# Patient Record
Sex: Male | Born: 1995 | Race: White | Hispanic: No | Marital: Single | Smoking: Never smoker
Health system: Southern US, Community
[De-identification: ages and names within clinical notes are randomized; demographics above are authoritative.]

## PROBLEM LIST (undated history)

## (undated) DIAGNOSIS — L409 Psoriasis, unspecified: Secondary | ICD-10-CM

## (undated) DIAGNOSIS — R569 Unspecified convulsions: Secondary | ICD-10-CM

## (undated) DIAGNOSIS — G40909 Epilepsy, unspecified, not intractable, without status epilepticus: Secondary | ICD-10-CM

## (undated) HISTORY — PX: CIRCUMCISION: SHX1350

## (undated) HISTORY — DX: Psoriasis, unspecified: L40.9

---
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
## 2015-05-27 ENCOUNTER — Encounter: Payer: Self-pay | Admitting: Neurology

## 2015-05-27 ENCOUNTER — Ambulatory Visit (INDEPENDENT_AMBULATORY_CARE_PROVIDER_SITE_OTHER): Payer: Medicaid Other | Admitting: Neurology

## 2015-05-27 VITALS — BP 102/70 | Ht 68.25 in | Wt 204.8 lb

## 2015-05-27 DIAGNOSIS — G40909 Epilepsy, unspecified, not intractable, without status epilepticus: Secondary | ICD-10-CM | POA: Diagnosis not present

## 2015-05-27 MED ORDER — LEVETIRACETAM 750 MG PO TABS: 750.0000 mg | ORAL_TABLET | Freq: Two times a day (BID) | ORAL | Status: DC

## 2015-05-27 NOTE — Progress Notes (Signed)
Patient: WHITMAN MEINHARDT MRN: [id number] Sex: male DOB: 05/03/1995  Provider: Keturah Shavers, MD Location of Care: [facility]  Note type: Routine return visit  Referral Source: Dr, Barbie Banner History from: patient, referring office, [facility] chart and mother Chief Complaint: Seizure disorder  History of Present Illness: DEPAUL ARIZPE is a 20 y.o. male is here for follow-up management of seizure disorder. He has history of generalized seizure disorder and has been on Keppra 750 mg twice a day since May 2015 with good seizure control and no seizure activity since then. He has been tolerating medication well with no side effects. There is no family history of epilepsy. His last EEG was in 12/25/2014 with normal results. His last visit was October 2016. Since then he has had no medical issues and has had no clinical seizure activity has been doing well with no other complaints. On his last visit it was discussed that if he remains seizure-free with normal EEG then we may consider taking him off of medication gradually over the next few months.   Review of Systems: 12 system review as per HPI, otherwise negative.  Past Medical History  Diagnosis Date  . Seizure (HCC)   . Epilepsy (HCC)    Hospitalizations: No., Head Injury: No., Nervous System Infections: No., Immunizations up to date: Yes.    Surgical History Past Surgical History  Procedure Laterality Date  . Circumcision      Family History family history is not on file.  Social History Social History   Social History  . Marital Status: Single    Spouse Name: N/A  . Number of Children: N/A  . Years of Education: N/A   Social History Main Topics  . Smoking status: Passive Smoke Exposure - Never Smoker  . Smokeless tobacco: Never Used  . Alcohol Use: No  . Drug Use: No  . Sexual Activity: Yes    Birth Control/ Protection: Condom   Other Topics Concern  . None   Social History  Narrative   Nels graduated from high school in 2014. He is not employed.   Linn enjoys playing video games and spending time with friends.   He is living with his parents and brother.    The medication list was reviewed and reconciled. All changes or newly prescribed medications were explained.  A complete medication list was provided to the patient/caregiver.  No Known Allergies  Physical Exam BP 102/70 mmHg  Ht 5' 8.25" (1.734 m)  Wt 204 lb 12.9 oz (92.9 kg)  BMI 30.90 kg/m2 Gen: Awake, alert, sitting on exam table in NAD.  Skin: Scaly plaque over nose and bilateral cheeks and on extensor surface of the arms.  HEENT: Normocephalic, atraumatic. MMM, pharynx non-erythematous.  Neck: Supple, no meningismus. No focal tenderness. No cervical or submandibular lymphadenopathy.  Resp: Clear to auscultation bilaterally CV: Regular rate, normal S1/S2, no murmurs Abd: abdomen soft, non-tender, non-distended. No hepatosplenomegaly or mass Ext: Warm and well-perfused. Pulses 2+ in bilateral radial and dorsalis pedis pulses.   Neurological Examination: MS: Awake, alert, interactive. Good eye contact. Fluent speech. Normal concentration and attention.  Cranial Nerves: PERRLA bilaterally (4-18mm); visual fields full; EOM intact with no nystagmus; no ptosis; facial expression symmetric at rest and with activation. Hearing intact to finger rub bilaterally. Palate elevates symmetrically. Tongue protrudes midline with normal movement; SCM and trapezius muscles 5/5 strength.  Tone-Normal Strength- 5/5 in upper and lower extremities bilaterally. No pronator drift. DTRs- 2+ bilaterally in biceps, triceps,  brachioradialis, patellar, and ankle reflexes Sensation: Intact to light touch, Romberg negative. Coordination: FTN and HTS intact with no dysmetria bilaterally.  Gait: gait is narrow based and normal. Tandem gait was normal.    Assessment and Plan 1. Seizure disorder [facility](HCC)     This is a 20 year old young male with history of generalized seizure disorder with no clinical seizure activity for about 2 years and normal EEG in November of last year. He has been on moderate dose of Keppra with good seizure control. He has no focal findings on his neurological examination. Since it has been 2 years from his last seizure and he would like to be off of the medication, I would recommend to gradually decrease the dose of medication or the next few months and if he remains seizure-free, he would be completely off of medication during the summer time. He will decrease the dose of Keppra half a tablet each month and then I will see him in 3 months for follow-up visit. I may repeat his EEG after his next visit when he is completely off of medication and if his EEG does not show any electrographic seizure activity and he remains seizure-free then he does not need to be on antiseizure medication and will be considered to be safe without being on any medication for seizure. I also discussed regarding the seizure triggers particularly lack of sleep and bright light. I also discussed the seizure precautions with patient and his mother while he is tapering his seizure medication. I would like to see him in 3 months for follow-up visit.  Meds ordered this encounter  Medications  . alclomethasone (ACLOVATE) 0.05 % cream    Sig: APPLY TO AFFECTED AREAS OF FACE 2 TIMES DAILY AS NEEDED    Refill:  1  . triamcinolone cream (KENALOG) 0.1 %    Sig: APPLY TO AFFECTED AREAS OF TRUNK AND EXTREMITIES 2 TIMES DAILY AS NEEDED    Refill:  1  . levETIRAcetam (KEPPRA) 750 MG tablet    Sig: Take 1 tablet (750 mg total) by mouth 2 (two) times daily.    Dispense:  62 tablet    Refill:  2

## 2015-05-28 ENCOUNTER — Other Ambulatory Visit: Payer: Self-pay | Admitting: Neurology

## 2015-09-24 NOTE — Progress Notes (Signed)
Patient: Cameron Jimenez MRN: [id number] Sex: male DOB: 11-Jul-1995  Provider: Keturah ShaversNABIZADEH, Nevelyn Mellott, MD [facility]  Note type: Routine return visit  Referral Source: Dr, Barbie BannerShayne E. Bates History from: patient, referring office and [facility] Chief Complaint: Seizure disorder  History of Present Illness:  Cameron Jimenez is a 20 y.o. male  is here for follow-up management of seizure disorder. He was last seen in clinic on 05/27/2015. He has history of generalized seizure disorder that is being managed on Keppra. He had been on Keppra 750 mg twice a day since May 2015 with good seizure control and no seizure activity since then so decision was made at his last clinic visit to gradually decrease and discontinue the dose. He is now down to Keppra 375 mg daily since 08/10/2015. He has not had any seizure activity with decrease in dose. He continues to tolerate the medication well, though has had some weight gain on it.   His last EEG was in 12/25/2014 with normal results. He has been medically very well with no clinical seizure activity since he was last seen. On his last visit it was discussed that if he remains seizure-free with normal EEG then we may leave him off of medication.    Review of Systems: 12 system review as per HPI, otherwise negative.  Past Medical History:  Diagnosis Date  . Epilepsy (HCC)   . Seizure (HCC)    Hospitalizations: No., Head Injury: No., Nervous System Infections: No., Immunizations up to date: Yes.    Birth History Normal  Surgical History Past Surgical History:  Procedure Laterality Date  . CIRCUMCISION      Family History family history is not on file. Family History is negative for seizure disorder.  Social History Social History   Social History  . Marital status: Single    Spouse name: N/A  . Number of children: N/A  . Years of education: N/A   Social History Main Topics  . Smoking status: Passive  Smoke Exposure - Never Smoker  . Smokeless tobacco: Never Used  . Alcohol use No  . Drug use: No  . Sexual activity: Yes    Birth control/ protection: Condom   Other Topics Concern  . None   Social History Narrative   Cameron Jimenez graduated from high school in 2014. He is not employed.   Cameron Jimenez enjoys playing video games and spending time with friends.   He is living with his parents and brother.     The medication list was reviewed and reconciled. All changes or newly prescribed medications were explained.  A complete medication list was provided to the patient/caregiver.  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 (interval improvement)  Neurologic Exam  Mental Status: alert; oriented to person, place and year; knowledge is normal for age; language is normal Cranial Nerves: visual fields are full to double simultaneous stimuli; extraocular movements are full and conjugate; pupils  are round reactive to light; funduscopic examination appears normal; symmetric facial strength; midline tongue and uvula Motor: Normal strength, tone and mass; good fine motor movements; no pronator drift Sensory: intact responses to proprioception and light touch Coordination: good finger-to-nose, rapid repetitive alternating movements Gait and Station: normal gait and station: patient is able to  walk on heels, toes and tandem without difficulty; balance is adequate; Romberg exam is negative Reflexes: symmetric and diminished bilaterally; no clonus; bilateral flexor plantar responses   Assessment and Plan 1. Seizure disorder (HCC) - EEG Child; Future  Cameron Jimenez is a 20 yo M with history of generalized seizure disorder with no clinical seizure activity for over 2 years now. He has remained seizure free even with gradual decrease in Keppra dose from 750 mg BID to 375 mg QDay over the last 4 months (decreased by 1/2 tablet or 375 mg every month). He had a normal EEG in November 2016. His physical exam remains benign today with no focal neurological findings. Given the fact that he has tolerated the gradual decrease in Keppra dose and remained seizure-free, and that his last decrease in the dose occurred > 1 month ago on 08/10/2015, I recommend that Cameron Jimenez stop the Keppra following today's visit. I gave him the option of repeating an EEG following discontinuation of Keppra and patient and mother would like to proceed with this in a few weeks.  If his EEG does not demonstrate any electrographic seizure activity and he remains seizure-free, he does not need to be seen for follow up in our clinic. If his EEG does demonstrate abnormalities or if he has clinical seizure acitivity, I will plan to see him back in the clinic at which time I will consider restarting Cameron Jimenez on antiseizure medication.     Orders Placed This Encounter  Procedures  . EEG Child    Standing Status:   Future    Standing Expiration Date:   09/24/2016

## 2015-09-25 ENCOUNTER — Ambulatory Visit (INDEPENDENT_AMBULATORY_CARE_PROVIDER_SITE_OTHER): Payer: Medicaid Other | Admitting: Neurology

## 2015-09-25 ENCOUNTER — Encounter: Payer: Self-pay | Admitting: Neurology

## 2015-09-25 VITALS — BP 120/70 | Ht 67.75 in | Wt 217.2 lb

## 2015-09-25 DIAGNOSIS — G40909 Epilepsy, unspecified, not intractable, without status epilepticus: Secondary | ICD-10-CM | POA: Diagnosis not present

## 2015-09-27 ENCOUNTER — Telehealth: Payer: Self-pay

## 2015-09-27 NOTE — Telephone Encounter (Signed)
Edwina, mom, lvm stating that child had a sz this morning. He was weaned off levetiracetam, today is day 2 completley off of the medication. Mom can be reached at : [phone].

## 2015-09-27 NOTE — Telephone Encounter (Signed)
I called mother. He had a clinical seizure activity during sleep for around 5 minutes witnessed by his brother. Mother was a shower when she came out, he was sleepy in bed but he was able to answer his mother partially. He did not have any tongue biting and no loss of bladder control but as per mother and as per brother he was jumping all over in his bed during those few minutes. He is already scheduled for an EEG next week but mother with not like to wait and would like to start him on medication again. I will start him back on the same dose of Keppra at 750 mg twice a day and after the EEG will decide ifb he can decrease the dose to 500 mg twice a day if no significant findings on EEG.

## 2015-10-03 ENCOUNTER — Ambulatory Visit (HOSPITAL_COMMUNITY)
Admission: RE | Admit: 2015-10-03 | Discharge: 2015-10-03 | Disposition: A | Discharge status: Home / Self Care | Payer: Medicaid Other | Source: Ambulatory Visit | Attending: Neurology | Admitting: Neurology

## 2015-10-03 DIAGNOSIS — R569 Unspecified convulsions: Secondary | ICD-10-CM | POA: Diagnosis not present

## 2015-10-03 DIAGNOSIS — G40909 Epilepsy, unspecified, not intractable, without status epilepticus: Secondary | ICD-10-CM | POA: Insufficient documentation

## 2015-10-03 DIAGNOSIS — Z79899 Other long term (current) drug therapy: Secondary | ICD-10-CM | POA: Insufficient documentation

## 2015-10-03 NOTE — Progress Notes (Signed)
OP EEG completed, results pending. 

## 2015-10-04 NOTE — Procedures (Signed)
Patient:  Cameron GrinderChristopher S Selsor   Sex: male  DOB:  1996/02/06  Date of study: 10/03/2015  Clinical history: This is a 20 year old male with history of generalized seizure disorder for which he had been on Keppra for several years with more than 2 years of being seizure free so the medication tapered missing a few months and discontinued recently. Patient had an episode of clinical seizure activity witnessed by his brother although it is not definitely clear if it was a true epileptic event. Patient was restarted on Keppra. EEG was done to benefit for possible epileptic event.  Medication: Keppra  Procedure: The tracing was carried out on a 32 channel digital Cadwell recorder reformatted into 16 channel montages with 1 devoted to EKG.  The 10 /20 international system electrode placement was used. Recording was done during awake and drowsiness. Recording time 30.5 Minutes.   Description of findings: Background rhythm consists of amplitude of 30 microvolt and frequency of  10 hertz posterior dominant rhythm. There was normal anterior posterior gradient noted. Background was well organized, continuous and symmetric with no focal slowing. There was muscle artifact noted. During drowsiness there was gradual decrease in background frequency noted. There were occasional vertex sharp waves noted as well.  Hyperventilation resulted in no significant slowing of the background activity. Photic stimulation using stepwise increase in photic frequency resulted in bilateral symmetric driving response. Throughout the recording there were no focal or generalized epileptiform activities in the form of spikes or sharps noted. There were no transient rhythmic activities or electrographic seizures noted. One lead EKG rhythm strip revealed sinus rhythm at a rate of 80 bpm.  Impression: This EEG is normal during awake and drowsy states. Please note that normal EEG does not exclude epilepsy, clinical correlation is  indicated.     Keturah ShaversNABIZADEH, Mauriah Mcmillen, MD

## 2015-10-08 ENCOUNTER — Telehealth: Payer: Self-pay

## 2015-10-08 NOTE — Telephone Encounter (Signed)
Mom lvm inquiring about EEG results and plan for medication. CB# [phone]

## 2015-10-09 MED ORDER — LEVETIRACETAM ER 500 MG PO TB24: 500.0000 mg | ORAL_TABLET | Freq: Every day | ORAL | 5 refills | Status: DC

## 2015-10-09 NOTE — Telephone Encounter (Signed)
I called mother and discussed the EEG result which was normal. I am not sure that the event he had was a real seizure but I would continue low-dose of long-acting Keppra for 4-6 months and then we'll decide if we can discontinue the medication again at that point. Mother understood and agreed with the plan. I would like to see him in 4-6 months for follow-up visit.

## 2015-10-17 ENCOUNTER — Encounter (HOSPITAL_COMMUNITY): Payer: Self-pay | Admitting: Emergency Medicine

## 2015-10-17 DIAGNOSIS — L02411 Cutaneous abscess of right axilla: Secondary | ICD-10-CM | POA: Insufficient documentation

## 2015-10-17 DIAGNOSIS — Z7722 Contact with and (suspected) exposure to environmental tobacco smoke (acute) (chronic): Secondary | ICD-10-CM | POA: Diagnosis not present

## 2015-10-17 NOTE — ED Triage Notes (Signed)
Pt. reports abscess at right axilla onset last week with drainage and swelling . Denies fever or chills.

## 2015-10-18 ENCOUNTER — Emergency Department (HOSPITAL_COMMUNITY)
Admission: EM | Admit: 2015-10-18 | Discharge: 2015-10-18 | Disposition: A | Discharge status: Home / Self Care | Payer: Medicaid Other | Attending: Emergency Medicine | Admitting: Emergency Medicine

## 2015-10-18 DIAGNOSIS — L02411 Cutaneous abscess of right axilla: Secondary | ICD-10-CM

## 2015-10-18 MED ORDER — TRAMADOL HCL 50 MG PO TABS: 50.0000 mg | ORAL_TABLET | Freq: Four times a day (QID) | ORAL | 0 refills | Status: AC | PRN

## 2015-10-18 MED ORDER — HYDROCODONE-ACETAMINOPHEN 5-325 MG PO TABS
1.0000 | ORAL_TABLET | Freq: Once | ORAL | Status: AC
  Administered 2015-10-18: 1 via ORAL
  Filled 2015-10-18: qty 1

## 2015-10-18 MED ORDER — DOXYCYCLINE HYCLATE 100 MG PO TABS
100.0000 mg | ORAL_TABLET | Freq: Once | ORAL | Status: AC
  Administered 2015-10-18: 100 mg via ORAL
  Filled 2015-10-18: qty 1

## 2015-10-18 MED ORDER — DOXYCYCLINE HYCLATE 100 MG PO CAPS: 100.0000 mg | ORAL_CAPSULE | Freq: Two times a day (BID) | ORAL | 0 refills | Status: AC

## 2015-10-18 NOTE — ED Provider Notes (Signed)
[facility]-EMERGENCY DEPT Provider Note   CSN: [id number] Arrival date & time: 10/17/15  2140     History   Chief Complaint Chief Complaint  Patient presents with  . Abscess    HPI Cameron Jimenez is a 20 y.o. male who presents to the ED with an abscess to the right axilla. The area started draining tonight. He has been applying warm wet compresses to the area.   The history is provided by the patient. No language interpreter was used.  Abscess  Location:  Shoulder/arm Shoulder/arm abscess location:  R axilla Abscess quality: draining   Red streaking: no   Duration:  4 days Progression:  Partially resolved Relieved by:  Nothing Worsened by:  Nothing Ineffective treatments:  Warm compresses Risk factors: prior abscess     Past Medical History:  Diagnosis Date  . Epilepsy (HCC)   . Seizure [facility])     Patient Active Problem List   Diagnosis Date Noted  . Syncope, near 06/28/2012  . Seizure disorder (HCC) 06/28/2012  . 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 Other convulsions 06/14/2012  . Syncope and collapse 06/14/2012  . Epilepsy [facility])     Past Surgical History:  Procedure Laterality Date  . CIRCUMCISION         Home Medications    Prior to Admission medications   Medication Sig Start Date End Date Taking? Authorizing Provider  alclomethasone (ACLOVATE) 0.05 % cream APPLY TO AFFECTED AREAS OF FACE 2 TIMES DAILY AS NEEDED 04/26/15   Historical Provider, MD  doxycycline (VIBRAMYCIN) 100 MG capsule Take 1 capsule (100 mg total) by mouth 2 (two) times daily. 10/18/15   Samanta Gal Orlene Och, NP  levETIRAcetam (KEPPRA XR) 500 MG 24 hr tablet Take 1 tablet (500 mg total) by mouth at bedtime. 10/09/15   Keturah Shavers, MD  traMADol (ULTRAM) 50 MG tablet Take 1 tablet (50 mg total) by mouth every 6 (six) hours as needed. 10/18/15   Toniyah Dilmore Orlene Och, NP  triamcinolone cream (KENALOG) 0.1 % APPLY TO AFFECTED AREAS OF TRUNK AND EXTREMITIES  2 TIMES DAILY AS NEEDED 04/26/15   Historical Provider, MD    Family History No family history on file.  Social History Social History  Substance Use Topics  . Smoking status: Passive Smoke Exposure - Never Smoker  . Smokeless tobacco: Never Used  . Alcohol use No     Allergies   Review of patient's allergies indicates no known allergies.   Review of Systems Review of Systems  Skin: Positive for wound.  all other systems negative  Physical Exam Updated Vital Signs BP 134/81 (BP Location: Right Arm)   Pulse 92   Temp 99.2 F (37.3 C) (Oral)   Resp 16   Ht 5\' 7"  (1.702 m)   Wt 98.4 kg   SpO2 98%   BMI 33.99 kg/m   Physical Exam  Constitutional: He is oriented to person, place, and time. He appears well-developed and well-nourished.  HENT:  Head: Normocephalic.  Eyes: EOM are normal.  Neck: Neck supple.  Cardiovascular: Normal rate.   Pulmonary/Chest: Effort normal.  Abdominal: Soft. There is no tenderness.  Musculoskeletal: Normal range of motion.  Neurological: He is alert and oriented to person, place, and time. No cranial nerve deficit.  Skin: Skin is warm and dry.  Right axilla with 1 cm area of erythema and purulent drainage. Tender on palpation.   Nursing note and vitals reviewed.    ED Treatments / Results  Labs (all labs ordered are listed, but only abnormal results are displayed) Labs Reviewed - No data to display   Radiology No results found.  Procedures Procedures (including critical care time)  Medications Ordered in ED Medications  doxycycline (VIBRA-TABS) tablet 100 mg (not administered)  HYDROcodone-acetaminophen (NORCO/VICODIN) 5-325 MG per tablet 1 tablet (not administered)     Initial Impression / Assessment and Plan / ED Course  I have reviewed the triage vital signs and the nursing notes.   Clinical Course    Final Clinical Impressions(s) / ED Diagnoses  20 y.o. male with abscess to the right axilla stable for d/c  without fever and does not appear toxic. He will take antibiotics and follow up with general surgery. He will return here as needed.   Final diagnoses:  Abscess of axilla, right    New Prescriptions New Prescriptions   DOXYCYCLINE (VIBRAMYCIN) 100 MG CAPSULE    Take 1 capsule (100 mg total) by mouth 2 (two) times daily.   TRAMADOL (ULTRAM) 50 MG TABLET    Take 1 tablet (50 mg total) by mouth every 6 (six) hours as needed.     [address] Kady Toothaker, [place] 10/18/15 [id number]    Azalia BilisKevin Campos, MD 10/18/15 [phone]

## 2016-01-07 ENCOUNTER — Ambulatory Visit (INDEPENDENT_AMBULATORY_CARE_PROVIDER_SITE_OTHER): Payer: Medicaid Other | Admitting: Neurology

## 2016-01-07 ENCOUNTER — Encounter (INDEPENDENT_AMBULATORY_CARE_PROVIDER_SITE_OTHER): Payer: Self-pay | Admitting: Neurology

## 2016-01-07 VITALS — Ht 68.0 in | Wt 217.8 lb

## 2016-01-07 DIAGNOSIS — G40909 Epilepsy, unspecified, not intractable, without status epilepticus: Secondary | ICD-10-CM

## 2016-01-07 NOTE — Progress Notes (Signed)
Patient: Cameron Jimenez MRN: [id number] Sex: male DOB: 04/05/1995  Provider: Keturah ShaversNABIZADEH, Dayten Juba, MD [facility]  Note type: Routine return visit  Referral Source: Dr. Barbie BannerShayne E. Bates History from: mother and patient Chief Complaint: Seizure Disorder  History of Present Illness: Cameron GrinderChristopher S Jimenez is a 20 y.o. male is here for follow-up management of seizure disorder. He was last seen in August 2017 and since he had been seizure-free for long time with gradual decrease in the dosage of Keppra and with normal EEG, it was decided to discontinue Keppra and see how he does. Within a couple of weeks after stopping the medication he had an episode of clinical seizure activity witnessed by his brother and mother which look like to be true epileptic event. He was via started on Keppra and underwent an EEG which was normal. Currently he is on 500 mg of long-acting Keppra, tolerating well with no side effects and has had no clinical seizure since then over the past 3 months.  Review of Systems: 12 system review as per HPI, otherwise negative.  Past Medical History:  Diagnosis Date  . Epilepsy (HCC)   . Seizure (HCC)    Hospitalizations: No., Head Injury: No., Nervous System Infections: No., Immunizations up to date: Yes.     Surgical History Past Surgical History:  Procedure Laterality Date  . CIRCUMCISION      Family History family history is not on file.   Social History Social History   Social History  . Marital status: Single    Spouse name: N/A  . Number of children: N/A  . Years of education: N/A   Social History Main Topics  . Smoking status: Passive Smoke Exposure - Never Smoker  . Smokeless tobacco: Never Used  . Alcohol use No  . Drug use: No  . Sexual activity: Yes    Birth control/ protection: Condom   Other Topics Concern  . None   Social History Narrative   Cameron DeerChristopher graduated from high school in 2014. He is not  employed.   Cameron DeerChristopher enjoys playing video games and spending time with friends.   He is living with his parents and brother.    The medication list was reviewed and reconciled. All changes or newly prescribed medications were explained.  A complete medication list was provided to the patient/caregiver.  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 (interval improvement)  Neurologic Exam  Mental Status: alert; oriented to person, place and year; knowledge is normal for age; language is normal Cranial Nerves: visual fields are full to double simultaneous stimuli; extraocular movements are full and conjugate; pupils are round reactive to light; funduscopic examination appears normal; symmetric facial strength; midline tongue and uvula Motor: Normal strength, tone and mass; good fine motor movements; no pronator drift Sensory: intact responses to proprioception and light touch Coordination: good finger-to-nose, rapid repetitive alternating movements Gait and Station: normal gait and station: patient is able to walk on  heels, toes and tandem without difficulty; balance is adequate; Romberg exam is negative Reflexes: symmetric and diminished bilaterally; no clonus; bilateral flexor plantar responses   Assessment and Plan 1. Seizure disorder [facility](HCC)    This is a 20 year old young male with history of  generalized seizure disorder who had been seizure-free for long time and the medication was discontinued a few months ago but he started having clinical seizure activity although his follow-up EEG was normal but he is started on low-dose Keppra and doing well since then. Currently he is on 500 mg Keppra XR which I recommend to continue although if there is any clinical seizure activity then we might need to increase the dose of medication. I discussed with patient regarding seizure precautions particularly seizure triggers including lack of sleep and bright light. I would like to see him in 8-9 months for follow-up visit although if there is any medical seizure activity, he will call to make an earlier appointment. He and his mother understood and agreed with the plan.

## 2016-04-15 ENCOUNTER — Other Ambulatory Visit: Payer: Self-pay | Admitting: Neurology

## 2016-10-13 ENCOUNTER — Ambulatory Visit (INDEPENDENT_AMBULATORY_CARE_PROVIDER_SITE_OTHER): Payer: Self-pay | Admitting: Neurology

## 2016-10-13 ENCOUNTER — Encounter (INDEPENDENT_AMBULATORY_CARE_PROVIDER_SITE_OTHER): Payer: Self-pay | Admitting: Neurology

## 2016-10-13 VITALS — BP 110/62 | HR 62 | Ht 67.72 in | Wt 224.6 lb

## 2016-10-13 DIAGNOSIS — G40909 Epilepsy, unspecified, not intractable, without status epilepticus: Secondary | ICD-10-CM

## 2016-10-13 MED ORDER — LEVETIRACETAM ER 500 MG PO TB24: 500.0000 mg | ORAL_TABLET | Freq: Every day | ORAL | 8 refills | Status: DC

## 2016-10-13 NOTE — Progress Notes (Signed)
Patient: Cameron Jimenez MRN: [id number] Sex: male DOB: Jan 17, 1996  Provider: Keturah Shavers, MD Location of Care: [facility]  Note type: Routine return visit  Referral Source: Dr. Jenne Pane- but not seeing her due to age- no current PCP History from: patient, mother Chief Complaint: follow up seizures  History of Present Illness: Cameron Jimenez is a 21 y.o. male  is here for management of seizure disorder . He has history of generalized seizure disorder for several years and since he was seizure-free for long time, he was taken off of his medication but he had an episode of clinical seizure after being off of medications so patient started his medication with low dose of 500 mg Keppra ER. She has been on this dose of medication over the past year with no more clinical seizure activity and his last EEG was normal. He has been tolerating medication well with no side effects. He usually sleeps well without any difficulty and has no behavioral or mood issues. He was recently started on methotrexate for his psoriasis otherwise no change in his medical condition since his last visit.  Review of Systems: 12 system review as per HPI, otherwise negative.  Past Medical History:  Diagnosis Date  . Epilepsy (HCC)   . Psoriasis    dx by Vaughan Sine  . Seizure (HCC)    Hospitalizations: No., Head Injury: No., Nervous System Infections: No., Immunizations up to date: Yes.    Surgical History Past Surgical History:  Procedure Laterality Date  . CIRCUMCISION      Family History family history includes Cancer in his maternal grandmother.   Social History Social History   Social History  . Marital status: Single    Spouse name: N/A  . Number of children: N/A  . Years of education: N/A   Social History Main Topics  . Smoking status: Passive Smoke Exposure - Never Smoker  . Smokeless tobacco: Never Used  . Alcohol use No  . Drug use: No  . Sexual activity: Yes     Birth control/ protection: Condom   Other Topics Concern  . None   Social History Narrative   Cameron Jimenez graduated from high school in 2014. He is not employed.   Cameron Jimenez enjoys playing video games and spending time with friends.   He is living with his parents and brother.   The medication list was reviewed and reconciled. All changes or newly prescribed medications were explained.  A complete medication list was provided to the patient/caregiver.  No Known Allergies  Physical Exam BP 110/62   Pulse 62   Ht 5' 7.72" (1.72 m)   Wt 224 lb 10.4 oz (101.9 kg)   BMI 34.44 kg/m  Gen: Awake, alert, not in distress Skin:  Psoriatic erythematous plaques on extensors HEENT: Normocephalic, no dysmorphic features, no conjunctival injection, nares patent, mucous membranes moist, oropharynx clear. Neck: Supple, no meningismus. No focal tenderness. Resp: Clear to auscultation bilaterally CV: Regular rate, normal S1/S2, no murmurs, no rubs Abd: BS present, abdomen soft, non-tender, non-distended. No hepatosplenomegaly or mass Ext: Warm and well-perfused. no muscle wasting, ROM full.  Neurological Examination: MS: Awake, alert, interactive. Normal eye contact, answered the questions appropriately, speech was fluent,  Normal comprehension.  Attention and concentration were normal. Cranial Nerves: Pupils were equal and reactive to light ( 5-46mm);  normal fundoscopic exam with sharp discs, visual field full with confrontation test; EOM normal, no nystagmus; no ptsosis, no double vision, intact facial sensation, face symmetric with full  strength of facial muscles, hearing intact to finger rub bilaterally, palate elevation is symmetric, tongue protrusion is symmetric with full movement to both sides.  Sternocleidomastoid and trapezius are with normal strength. Tone-Normal Strength-Normal strength in all muscle groups DTRs-  Biceps Triceps Brachioradialis Patellar Ankle  R 2+ 2+ 2+ 2+ 2+  L 2+  2+ 2+ 2+ 2+   Plantar responses flexor bilaterally, no clonus noted Sensation: Intact to light touch,  Romberg negative. Coordination: No dysmetria on FTN test. No difficulty with balance. Gait: Normal walk and run. Tandem gait was normal. Was able to perform toe walking and heel walking without difficulty.   Assessment and Plan 1. Seizure disorder [facility](HCC)    This is a 21 year old male with history of generalized seizure disorder, currently on very low-dose of Keppra with no clinical seizure activity since last year and normal EEG which was done last year. He has no focal findings and his neurological examination. Since he hasn't had any clinical seizure activity, I will continue the same low dose of Keppra ER at 500 mg every night. If there is any clinical seizure activity, mother will call my office to increased dose of medication. He will continue with appropriate sleep and limited screen time. I do not think he needs a follow-up EEG at this point. I would like to see him in 8-9 months for follow-up visit. He and his mother understood and agreed with the plan.  Meds ordered this encounter  Medications  . methotrexate (RHEUMATREX) 2.5 MG tablet    Sig: TAKE 2 TABLETS BY MOUTH EVERY 12 HOURS x 3 doses per (6 tabs per week)    Refill:  0  . levETIRAcetam (KEPPRA XR) 500 MG 24 hr tablet    Sig: Take 1 tablet (500 mg total) by mouth at bedtime.    Dispense:  30 tablet    Refill:  8

## 2016-11-23 ENCOUNTER — Other Ambulatory Visit: Payer: Self-pay | Admitting: Family

## 2018-01-19 ENCOUNTER — Other Ambulatory Visit (INDEPENDENT_AMBULATORY_CARE_PROVIDER_SITE_OTHER): Payer: Self-pay | Admitting: Neurology

## 2018-01-24 ENCOUNTER — Ambulatory Visit (INDEPENDENT_AMBULATORY_CARE_PROVIDER_SITE_OTHER): Payer: Self-pay | Admitting: Neurology

## 2018-02-10 ENCOUNTER — Ambulatory Visit (INDEPENDENT_AMBULATORY_CARE_PROVIDER_SITE_OTHER): Payer: Self-pay | Admitting: Neurology

## 2018-02-10 ENCOUNTER — Encounter (INDEPENDENT_AMBULATORY_CARE_PROVIDER_SITE_OTHER): Payer: Self-pay | Admitting: Neurology

## 2018-02-10 VITALS — BP 116/70 | HR 74 | Ht 68.11 in | Wt 226.9 lb

## 2018-02-10 DIAGNOSIS — G40909 Epilepsy, unspecified, not intractable, without status epilepticus: Secondary | ICD-10-CM

## 2018-02-10 MED ORDER — LEVETIRACETAM 250 MG PO TABS: 250.0000 mg | ORAL_TABLET | Freq: Every day | ORAL | 1 refills | Status: DC

## 2018-02-10 NOTE — Progress Notes (Signed)
Patient: Cameron Jimenez MRN: [id number] Sex: male DOB: [birth year]  Provider: Keturah Shaverseza Harsha Yusko, MD [facility]  Note type: Routine return visit  Referral Source: Cameron MouldShayne Bates, MD [facility]: [facility] and Mom Chief Complaint: Follow up on seizures  History of Present Illness: Cameron Jimenez is a 23 y.o. male is here for follow-up management of seizure disorder.  He has history of generalized seizure disorder for long time and has been on Keppra with no clinical seizure activity and with normal EEGs for the past few years although he was tried to be off of medication a couple of years ago when he had a clinical seizure activity a few days after being off of medication so he was back on medication on fairly low-dose of 500 mg Keppra ER every night and no clinical seizure activity since then which is around 2 years. His last EEG was in 2017 which was normal.  Over the past couple of years he has not had any clinical seizure activity and has been taking low-dose Keppra regularly every night. He also has psoriasis for which she has been taking methotrexate and also has acne for which he is on doxycycline. He is doing well otherwise and usually sleeps well and currently is not working or going to college and usually stays home.  He does not drive.  Review of Systems: 12 system review as per HPI, otherwise negative.  Past Medical History:  Diagnosis Date  . Epilepsy (HCC)   . Psoriasis    dx by Vaughan Sineerm  . Seizure (HCC)    Hospitalizations: No., Head Injury: No., Nervous System Infections: No., Immunizations up to date: Yes.    Surgical History Past Surgical History:  Procedure Laterality Date  . CIRCUMCISION      Family History family history includes Cancer in his maternal grandmother.   Social History Social History   Socioeconomic History  . Marital status: Single    Spouse name: Not on file  . Number of children: Not on  file  . Years of education: Not on file  . Highest education level: Not on file  Occupational History  . Not on file  Social Needs  . Financial resource strain: Not on file  . Food insecurity:    Worry: Not on file    Inability: Not on file  . Transportation needs:    Medical: Not on file    Non-medical: Not on file  Tobacco Use  . Smoking status: Passive Smoke Exposure - Never Smoker  . Smokeless tobacco: Never Used  Substance and Sexual Activity  . Alcohol use: No  . Drug use: No  . Sexual activity: Yes    Birth control/protection: Condom  Lifestyle  . Physical activity:    Days per week: Not on file    Minutes per session: Not on file  . Stress: Not on file  Relationships  . Social connections:    Talks on phone: Not on file    Gets together: Not on file    Attends religious service: Not on file    Active member of club or organization: Not on file    Attends meetings of clubs or organizations: Not on file    Relationship status: Not on file  Other Topics Concern  . Not on file  Social History Narrative   Cameron Jimenez graduated from high school in 2014. He is not employed.   Cameron Jimenez enjoys playing video games and spending time with friends.  He is living with his parents and brother.     The medication list was reviewed and reconciled. All changes or newly prescribed medications were explained.  A complete medication list was provided to the patient/caregiver.  No Known Allergies  Physical Exam BP 116/70   Pulse 74   Ht 5' 8.11" (1.73 m)   Wt 226 lb 13.7 oz (102.9 kg)   BMI 34.38 kg/m  Gen: Awake, alert, not in distress Skin: No rash, No neurocutaneous stigmata.  Has psoriasis plaques on his extensors HEENT: Normocephalic, no conjunctival injection, nares patent, mucous membranes moist, oropharynx clear. Neck: Supple, no meningismus. No cervical bruit. No focal tenderness. Resp: Clear to auscultation bilaterally CV: Regular rate, normal S1/S2, no  murmurs, no rubs Abd: abdomen soft, non-tender, non-distended. No hepatosplenomegaly or mass Ext: Warm and well-perfused. no muscle wasting, ROM full.  Neurological Examination: MS: Awake, alert, interactive. Normal eye contact, answered the questions appropriately, speech was fluent,  Normal comprehension.  Attention and concentration were normal. Cranial Nerves: Pupils were equal and reactive to light ( 5-483mm); no APD, normal fundoscopic exam with sharp discs, visual field full with confrontation test; EOM normal, no nystagmus; no ptsosis, no double vision, intact facial sensation, face symmetric with full strength of facial muscles, hearing intact to  Finger rub bilaterally,  tongue protrusion is symmetric with full movement to both sides.  Sternocleidomastoid and trapezius are with normal strength. Tone-Normal Strength-Normal strength in all muscle groups DTRs-  Biceps Triceps Brachioradialis Patellar Ankle  R 2+ 2+ 2+ 2+ 2+  L 2+ 2+ 2+ 2+ 2+   Plantar responses flexor bilaterally, no clonus noted Sensation: Intact to light touch,  Romberg negative. Coordination: No dysmetria on FTN test. Normal RAM. No difficulty with balance. Gait: Normal walk and run. Tandem gait was normal. Was able to perform toe walking and heel walking without difficulty.   Assessment and Plan 1. Seizure disorder [facility](HCC)    This is a 23 year old male with history of seizure disorder since 2012 for which he was initially on Lamictal and after tapering in 2014 started having clinical seizure activity and then he was on Keppra with no clinical seizure activity but again after tapering in 2017 he had another clinical seizure activity although his EEG was negative. Currently he is on very low-dose of Keppra with no seizure over the past 3 years.  He has no other issues and has normal neurological exam. Recommend to decrease the dose of Keppra to 250 mg every night for the next couple of months and if he continues to  be seizure-free, he may discontinue the Keppra but if there is any clinical seizure activity then I would go back up on the medication. I would like to perform a sleep deprived EEG when he is on lower dose of medication in a couple weeks. I also discussed with patient the importance of adequate sleep and limited screen time to prevent from seizure activity. I do not think he needs follow-up appointment with neurology but if his EEG is abnormal or if he had more clinical seizure activity then I would schedule him for a follow-up visit otherwise he will continue follow-up with his primary care physician.  He and his mother understood and agreed with the plan.   Meds ordered this encounter  Medications  . levETIRAcetam (KEPPRA) 250 MG tablet    Sig: Take 1 tablet (250 mg total) by mouth at bedtime.    Dispense:  30 tablet    Refill:  1  Orders Placed This Encounter  Procedures  . Child sleep deprived EEG    Standing Status:   Future    Standing Expiration Date:   02/10/2019

## 2018-02-10 NOTE — Patient Instructions (Signed)
Continue with Keppra 500/250 every other day for the next 10 days Then continue with Keppra 250 every night for 2 months If no seizure, discontinue medication at that time We will perform EEG in a couple of weeks If no seizure, no need for follow-up appointment although if there is any question or concerns, call the office at any time

## 2018-03-02 ENCOUNTER — Ambulatory Visit (INDEPENDENT_AMBULATORY_CARE_PROVIDER_SITE_OTHER): Payer: Self-pay | Admitting: Neurology

## 2018-03-02 DIAGNOSIS — G40909 Epilepsy, unspecified, not intractable, without status epilepticus: Secondary | ICD-10-CM

## 2018-03-03 ENCOUNTER — Telehealth (INDEPENDENT_AMBULATORY_CARE_PROVIDER_SITE_OTHER): Payer: Self-pay | Admitting: Neurology

## 2018-03-03 NOTE — Procedures (Signed)
Patient:  Cameron Jimenez   Sex: male  DOB:  1995/05/05   Date of study: 03/02/2018  Clinical history: This is a 23 year old male with history of seizure disorder since 2012 who was seizure-free for a while and was on tapering of Keppra when he had another seizure in 2017 and since then he has been on very low-dose of Keppra and now he is trying to taper and discontinue medication.  This is a follow-up EEG on very low-dose of Keppra.  Medication: Keppra  Procedure: The tracing was carried out on a 32 channel digital Cadwell recorder reformatted into 16 channel montages with 1 devoted to EKG.  The 10 /20 international system electrode placement was used. Recording was done during awake, drowsiness and sleep states. Recording time 50 minutes.   Description of findings: Background rhythm consists of amplitude of 50 microvolt and frequency of 10-11 hertz posterior dominant rhythm. There was normal anterior posterior gradient noted. Background was well organized, continuous and symmetric with no focal slowing. There was muscle artifact noted. During drowsiness there was gradual decrease in background frequency noted.  There was no significant sleep portion noted with occasional brief vertex sharp waves but no sleep spindles noted.    Hyperventilation resulted in slowing of the background activity. Photic stimulation using stepwise increase in photic frequency resulted in bilateral symmetric driving response. Throughout the recording there were no focal or generalized epileptiform activities in the form of spikes or sharps noted. There were no transient rhythmic activities or electrographic seizures noted. One lead EKG rhythm strip revealed sinus rhythm at a rate of 70 bpm.  Impression: This EEG is normal during awake and asleep states. Please note that normal EEG does not exclude epilepsy, clinical correlation is indicated.    Keturah Shavers, MD

## 2018-03-03 NOTE — Telephone Encounter (Signed)
lvm for mom to return my call regarding EEG results.

## 2018-03-03 NOTE — Telephone Encounter (Signed)
I reviewed his EEG which is normal. Please call patient or his mother and let them know that the EEG is normal and he can taper and discontinue medication over the next month as it was scheduled.

## 2018-03-03 NOTE — Telephone Encounter (Signed)
Who's calling (name and relationship to patient) : Almetta Lovely  Best contact number: [phone]  Provider they see: Dr. Devonne Doughty  Reason for call: Returning call with EEG results  Call ID:      PRESCRIPTION REFILL ONLY  Name of prescription:  Pharmacy:

## 2018-03-03 NOTE — Telephone Encounter (Signed)
Spoke to mom and let her know the results per Nab and to continue with the taper of the medication. Mom understood

## 2018-03-09 ENCOUNTER — Other Ambulatory Visit (INDEPENDENT_AMBULATORY_CARE_PROVIDER_SITE_OTHER): Payer: Self-pay | Admitting: Neurology

## 2018-04-19 ENCOUNTER — Telehealth (INDEPENDENT_AMBULATORY_CARE_PROVIDER_SITE_OTHER): Payer: Self-pay | Admitting: Neurology

## 2018-04-19 MED ORDER — LEVETIRACETAM ER 750 MG PO TB24: 750.0000 mg | ORAL_TABLET | Freq: Every day | ORAL | 2 refills | Status: DC

## 2018-04-19 NOTE — Telephone Encounter (Signed)
Called mother and recommend to start Keppra again and most likely will continue medication for several years since this is the second time that he would have seizure after tapering the medication.  I sent a prescription for Keppra XR 750 mg to take every night.  Tresa Endo, Please schedule the patient for a sleep deprived EEG and appointment on the same day probably for 6 to 8 weeks.  Call mother and let her know.  Thanks

## 2018-04-19 NOTE — Telephone Encounter (Signed)
°  Who's calling (name and relationship to patient) : Perren,Edwina Best contact number: [phone] Provider they see: Nab Reason for call: Mom called to inform Dr. Merri Brunette that Cristal Deer just had a seizure.  Mom was not home when this seizure happened so she did not whiteness it but his younger brother did.  EMS came to examine Antoino and told mom he was ok.  She said Dr. Merri Brunette wanted Deleon to discontinue his Keppra.  His last does was 04/15/18.  Please advise mom on what she should do.    PRESCRIPTION REFILL ONLY  Name of prescription:  Pharmacy:

## 2018-04-19 NOTE — Telephone Encounter (Signed)
Spoke to mom and she stated that his younger brother stated Cameron Jimenez was asleep in his bed and then he began to shake and flop around. Mom is unsure of how long the seizure lasted she would guess about 5 minutes. Mom wanted Dr. Merri Brunette to call her to discuss more.

## 2018-04-19 NOTE — Addendum Note (Signed)
Addended byKeturah Shavers on: 04/19/2018 05:49 PM   Modules accepted: Orders

## 2018-04-20 ENCOUNTER — Other Ambulatory Visit (INDEPENDENT_AMBULATORY_CARE_PROVIDER_SITE_OTHER): Payer: Self-pay | Admitting: Neurology

## 2018-04-20 DIAGNOSIS — R569 Unspecified convulsions: Secondary | ICD-10-CM

## 2018-04-20 NOTE — Telephone Encounter (Signed)
lvm for mom to return my call to schedule EEG and appt. There is an opening for May 6th @ 8:00 with a follow up with Nab after

## 2018-04-21 NOTE — Telephone Encounter (Signed)
Cameron Jimenez scheduled the EEG and follow up

## 2018-06-15 ENCOUNTER — Ambulatory Visit (INDEPENDENT_AMBULATORY_CARE_PROVIDER_SITE_OTHER): Payer: Self-pay | Admitting: Neurology

## 2018-06-15 ENCOUNTER — Other Ambulatory Visit: Payer: Self-pay

## 2018-06-15 ENCOUNTER — Encounter (INDEPENDENT_AMBULATORY_CARE_PROVIDER_SITE_OTHER): Payer: Self-pay | Admitting: Neurology

## 2018-06-15 VITALS — BP 110/74 | HR 90 | Ht 68.11 in | Wt 231.7 lb

## 2018-06-15 DIAGNOSIS — G40309 Generalized idiopathic epilepsy and epileptic syndromes, not intractable, without status epilepticus: Secondary | ICD-10-CM

## 2018-06-15 DIAGNOSIS — G40909 Epilepsy, unspecified, not intractable, without status epilepticus: Secondary | ICD-10-CM

## 2018-06-15 MED ORDER — LEVETIRACETAM ER 750 MG PO TB24: 750.0000 mg | ORAL_TABLET | Freq: Every day | ORAL | 6 refills | Status: DC

## 2018-06-15 NOTE — Patient Instructions (Signed)
Your EEG is normal Continue with the same dose of Keppra at 750 mg every night If there is any seizure activity call my office otherwise I would like to see you in 6 months for follow-up visit.

## 2018-06-15 NOTE — Progress Notes (Signed)
Patient: Cameron Jimenez MRN: [id number] Sex: male DOB: [birth year]  Provider: Keturah Shavers, MD Location of Care: [facility] Child Neurology  Note type: Routine return visit  Referral Source: Verner Mould, MD History from: patient, [facility] chart and mom Chief Complaint: Seizure disorder, EEG Result  History of Present Illness: Cameron Jimenez is a 23 y.o. male is here for follow-up management of seizure disorder.  He has a diagnosis of generalized seizure disorder based on his clinical episodes although he has had several normal EEGs.  He was initially on Lamictal and then on Keppra and over the past couple of years had 2 attempts to taper and discontinue medication but he had another clinical seizure activity so he was restarted on medication which is currently low to moderate dose of Keppra at 750 mg nightly. Over the past few months he has not had any seizure activity since he is on this dose of Keppra and his EEG in January and also his EEG today prior to this visit were normal. He has been tolerating Keppra well with no side effects and has not had any abnormal movements concerning for seizure activity and he and his mother are happy with his progress.  Review of Systems: 12 system review as per HPI, otherwise negative.  Past Medical History:  Diagnosis Date  . Epilepsy (HCC)   . Psoriasis    dx by Vaughan Sine  . Seizure (HCC)    Hospitalizations: No., Head Injury: No., Nervous System Infections: No., Immunizations up to date: Yes.     Surgical History Past Surgical History:  Procedure Laterality Date  . CIRCUMCISION      Family History family history includes Cancer in his maternal grandmother.   Social History Social History   Socioeconomic History  . Marital status: Single    Spouse name: Not on file  . Number of children: Not on file  . Years of education: Not on file  . Highest education level: Not on file  Occupational History  . Not on file  Social  Needs  . Financial resource strain: Not on file  . Food insecurity:    Worry: Not on file    Inability: Not on file  . Transportation needs:    Medical: Not on file    Non-medical: Not on file  Tobacco Use  . Smoking status: Passive Smoke Exposure - Never Smoker  . Smokeless tobacco: Never Used  Substance and Sexual Activity  . Alcohol use: No  . Drug use: No  . Sexual activity: Yes    Birth control/protection: Condom  Lifestyle  . Physical activity:    Days per week: Not on file    Minutes per session: Not on file  . Stress: Not on file  Relationships  . Social connections:    Talks on phone: Not on file    Gets together: Not on file    Attends religious service: Not on file    Active member of club or organization: Not on file    Attends meetings of clubs or organizations: Not on file    Relationship status: Not on file  Other Topics Concern  . Not on file  Social History Narrative   [name] graduated from high school in 2014. He is not employed.   [name] enjoys playing video games and spending time with friends.   He is living with his parents and brother.    The medication list was reviewed and reconciled. All changes or newly prescribed medications were explained.  A complete medication list was provided to the patient/caregiver.  No Known Allergies  Physical Exam BP 110/74   Pulse 90   Ht 5' 8.11" (1.73 m)   Wt 231 lb 11.3 oz (105.1 kg)   BMI 35.12 kg/m  ZOX:WRUEAGen:Awake, alert, not in distress Skin:No rash, No neurocutaneous stigmata.  Has psoriasis plaques on his extensors HEENT:Normocephalic, no conjunctival injection, nares patent, mucous membranes moist, oropharynx clear. Neck:Supple, no meningismus. No focal tenderness. Resp: Clear to auscultation bilaterally VW:UJWJXBJCV:Regular rate, normal S1/S2, no murmurs, no rubs YNW:GNFAOZHAbd:abdomen soft, non-tender, non-distended. No hepatosplenomegaly or mass YQM:VHQIExt:Warm and well-perfused. no muscle wasting, ROM  full.  Neurological Examination: ON:GEXBMS:Awake, alert, interactive. Normal eye contact, answered the questions appropriately, speech was fluent, Normal comprehension. Attention and concentration were normal. Cranial Nerves:Pupils were equal and reactive to light ( 5-583mm); no APD, normal fundoscopic exam with sharp discs, visual field full with confrontation test; EOM normal, no nystagmus; no ptsosis, no double vision, intact facial sensation, face symmetric with full strength of facial muscles, hearing intact to Finger rub bilaterally, tongue protrusion is symmetric with full movement to both sides. Sternocleidomastoid and trapezius are with normal strength. Tone-Normal Strength-Normal strength in all muscle groups DTRs-  Biceps Triceps Brachioradialis Patellar Ankle  R 2+ 2+ 2+ 2+ 2+  L 2+ 2+ 2+ 2+ 2+   Plantar responses flexor bilaterally, no clonus noted Sensation:Intact to light touch,  Romberg negative. Coordination:No dysmetria on FTN test. Normal RAM. No difficulty with balance. Gait:Normal walk and run. Tandem gait was normal.    Assessment and Plan 1. Seizure disorder [facility](HCC)    This is a 23 year old male with history of clinical seizure disorder since 2012, currently on fairly low-dose of Keppra at 750 mg every night with no more clinical seizure activity over the past several months.  He has normal neurological exam and his recent EEG today was normal. Recommend to continue the same dose of Keppra ER at 750 mg every night I discussed with patient and his mother again regarding seizure triggers particularly lack of sleep and bright light and prolonged screen time If there is any clinical seizure activity mother will call my office and let me know to increase the dose of medication as needed otherwise I would like to see him in 6 months for follow-up visit.  He and his mother understood and agreed with the plan.  Meds ordered this encounter  Medications  . Levetiracetam  (KEPPRA XR) 750 MG TB24    Sig: Take 1 tablet (750 mg total) by mouth at bedtime.    Dispense:  30 tablet    Refill:  6

## 2018-06-16 NOTE — Procedures (Signed)
Patient:  Cameron Jimenez   Sex: male  DOB:  05-12-1995  Date of study:  06/16/2018  Clinical history: This is a 23 year old male with history of seizure disorder since 2012 who was seizure-free for a while and was on tapering of Keppra for the second time when he had another seizure a few months ago and since then he has been on very low-dose of Keppra.  He is previous EEG was normal. This is a follow-up EEG on very low-dose of Keppra.  Medication: Keppra  Procedure: The tracing was carried out on a 32 channel digital Cadwell recorder reformatted into 16 channel montages with 1 devoted to EKG.  The 10 /20 international system electrode placement was used. Recording was done during awake, drowsiness and sleep states. Recording time 50 minutes.   Description of findings: Background rhythm consists of amplitude of 45 microvolt and frequency of 10-11 hertz posterior dominant rhythm. There was normal anterior posterior gradient noted. Background was well organized, continuous and symmetric with no focal slowing. There was muscle artifact noted. During drowsiness there was gradual decrease in background frequency noted.  There was no significant sleep portion noted with occasional brief vertex sharp waves but no sleep spindles noted.    Hyperventilation resulted in slowing of the background activity. Photic stimulation using stepwise increase in photic frequency resulted in bilateral symmetric driving response. Throughout the recording there were no focal or generalized epileptiform activities in the form of spikes or sharps noted. There were no transient rhythmic activities or electrographic seizures noted. One lead EKG rhythm strip revealed sinus rhythm at a rate of 70 bpm.  Impression: This EEG is normal during awake and asleep states. Please note that normal EEG does not exclude epilepsy, clinical correlation is indicated.     Keturah Shavers, MD

## 2018-07-15 ENCOUNTER — Other Ambulatory Visit (INDEPENDENT_AMBULATORY_CARE_PROVIDER_SITE_OTHER): Payer: Self-pay | Admitting: Neurology

## 2018-11-10 ENCOUNTER — Other Ambulatory Visit (INDEPENDENT_AMBULATORY_CARE_PROVIDER_SITE_OTHER): Payer: Self-pay | Admitting: Neurology

## 2018-12-16 ENCOUNTER — Ambulatory Visit (INDEPENDENT_AMBULATORY_CARE_PROVIDER_SITE_OTHER): Payer: Self-pay | Admitting: Neurology

## 2019-01-12 ENCOUNTER — Ambulatory Visit (INDEPENDENT_AMBULATORY_CARE_PROVIDER_SITE_OTHER): Payer: Self-pay | Admitting: Neurology

## 2019-01-12 ENCOUNTER — Encounter (INDEPENDENT_AMBULATORY_CARE_PROVIDER_SITE_OTHER): Payer: Self-pay | Admitting: Neurology

## 2019-01-12 ENCOUNTER — Other Ambulatory Visit: Payer: Self-pay

## 2019-01-12 VITALS — BP 110/68 | HR 80 | Ht 68.11 in | Wt 227.1 lb

## 2019-01-12 DIAGNOSIS — G40909 Epilepsy, unspecified, not intractable, without status epilepticus: Secondary | ICD-10-CM

## 2019-01-12 MED ORDER — LEVETIRACETAM ER 750 MG PO TB24: 750.0000 mg | ORAL_TABLET | Freq: Every day | ORAL | 8 refills | Status: DC

## 2019-01-12 NOTE — Progress Notes (Signed)
Patient: Cameron Jimenez MRN: [id number] Sex: male DOB: June 09, 1995  Provider: Teressa Lower, MD Location of Care: [facility] Child Neurology  Note type: Routine return visit  Referral Source: Remi Deter, MD History from: patient, [facility] chart and Mom Chief Complaint: Seizures   History of Present Illness: Cameron Jimenez is a 23 y.o. male is here for follow-up management of seizure disorder.  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  He was tried 2 times over the past few years to taper and discontinue medication but he started having clinical seizure activity although his EEGs were normal recently. Currently he is on very low-dose of Keppra at 750 mg daily with no more seizure activity and without having any other issues since his last visit in May. He has been taking his medication regularly without any missing doses and with no side effects.  He usually sleeps well without any difficulty although he sleeps very late or early in the morning until noon time.  He has no anxiety or mood issues and doing well otherwise.  He and his mother do not have any other questions or concerns at this time.  Review of Systems: Review of system as per HPI, otherwise negative.  Past Medical History:  Diagnosis Date  . Epilepsy ([place])   . Psoriasis    dx by Payton Mccallum  . Seizure ([place])    Hospitalizations: No., Head Injury: No., Nervous System Infections: No., Immunizations up to date: Yes.     Surgical History Past Surgical History:  Procedure Laterality Date  . CIRCUMCISION      Family History family history includes Cancer in his maternal grandmother.   Social History Social History   Socioeconomic History  . Marital status: Single    Spouse name: Not on file  . Number of children: Not on file  . Years of education: Not on file  . Highest education level: Not on file   Occupational History  . Not on file  Social Needs  . Financial resource strain: Not on file  . Food insecurity    Worry: Not on file    Inability: Not on file  . Transportation needs    Medical: Not on file    Non-medical: Not on file  Tobacco Use  . Smoking status: Passive Smoke Exposure - Never Smoker  . Smokeless tobacco: Never Used  Substance and Sexual Activity  . Alcohol use: No  . Drug use: No  . Sexual activity: Yes    Birth control/protection: Condom  Lifestyle  . Physical activity    Days per week: Not on file    Minutes per session: Not on file  . Stress: Not on file  Relationships  . Social Herbalist on phone: Not on file    Gets together: Not on file    Attends religious service: Not on file    Active member of club or organization: Not on file    Attends meetings of clubs or organizations: Not on file    Relationship status: Not on file  Other Topics Concern  . Not on file  Social History Narrative   Lakeith graduated from high school in 2014. He is not employed.   Ethaniel enjoys playing video games and spending time with friends.   He is living with his parents and brother.     No Known Allergies  Physical Exam BP 110/68  Pulse 80   Ht 5' 8.11" (1.73 m)   Wt 227 lb 1.2 oz (103 kg)   BMI 34.42 kg/m  Gen: Awake, alert, not in distress Skin: No rash, No neurocutaneous stigmata.  Psoriasis plaques on his extensors HEENT: Normocephalic, no dysmorphic features, no conjunctival injection, nares patent, mucous membranes moist, oropharynx clear. Neck: Supple, no meningismus. No focal tenderness. Resp: Clear to auscultation bilaterally CV: Regular rate, normal S1/S2, no murmurs, no rubs Abd: BS present, abdomen soft, non-tender, non-distended. No hepatosplenomegaly or mass Ext: Warm and well-perfused. No deformities, no muscle wasting, ROM full.  Neurological Examination: MS: Awake, alert, interactive. Normal eye contact, answered  the questions appropriately, speech was fluent,  Normal comprehension.  Attention and concentration were normal. Cranial Nerves: Pupils were equal and reactive to light ( 5-38mm);  normal fundoscopic exam with sharp discs, visual field full with confrontation test; EOM normal, no nystagmus; no ptsosis, no double vision, intact facial sensation, face symmetric with full strength of facial muscles, hearing intact to finger rub bilaterally, palate elevation is symmetric, tongue protrusion is symmetric with full movement to both sides.  Sternocleidomastoid and trapezius are with normal strength. Tone-Normal Strength-Normal strength in all muscle groups DTRs-  Biceps Triceps Brachioradialis Patellar Ankle  R 2+ 2+ 2+ 2+ 2+  L 2+ 2+ 2+ 2+ 2+   Plantar responses flexor bilaterally, no clonus noted Sensation: Intact to light touch,  Romberg negative. Coordination: No dysmetria on FTN test. No difficulty with balance. Gait: Normal walk and run. Tandem gait was normal. Was able to perform toe walking and heel walking without difficulty.   Assessment and Plan 1. 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  His last EEG in May 2020 was normal. I discussed with patient that at this time I would continue the same dose of medication at 750 mg daily. I discussed with patient the importance of appropriate sleep and it would be better to have a good night's sleep and sleep earlier. Also he needs to have limited screen time and try to have regular exercise and avoid weight gain. He does not need follow-up EEG at this point. I also discussed with patient that anytime that he is ready to be transferred to adult neurology, I will send records. Next appointment will be around 8 months from now but he will call  if there is any question concerns or if he would like to be transferred.  He and his mother understood and agreed with the plan.   Meds ordered this encounter  Medications  . Levetiracetam 750 MG TB24    Sig: Take 1 tablet (750 mg total) by mouth at bedtime.    Dispense:  30 tablet    Refill:  8

## 2019-01-12 NOTE — Patient Instructions (Signed)
Continue the same dose of Keppra Have adequate sleep and limited screen time Try to have more night to sleep and sleep at the specific time without having any electronic at bedtime If there is any seizure call my office and let me know Any time did you are ready to transfer to adult neurology, we will send records Return in 8 months for follow-up visit

## 2019-09-11 ENCOUNTER — Other Ambulatory Visit: Payer: Self-pay

## 2019-09-11 ENCOUNTER — Ambulatory Visit (INDEPENDENT_AMBULATORY_CARE_PROVIDER_SITE_OTHER): Payer: Self-pay | Admitting: Neurology

## 2019-09-11 ENCOUNTER — Encounter (INDEPENDENT_AMBULATORY_CARE_PROVIDER_SITE_OTHER): Payer: Self-pay | Admitting: Neurology

## 2019-09-11 VITALS — BP 112/72 | HR 76 | Ht 68.11 in | Wt 212.3 lb

## 2019-09-11 DIAGNOSIS — G40909 Epilepsy, unspecified, not intractable, without status epilepticus: Secondary | ICD-10-CM

## 2019-09-11 MED ORDER — LEVETIRACETAM ER 750 MG PO TB24: 750.0000 mg | ORAL_TABLET | Freq: Every day | ORAL | 8 refills | Status: DC

## 2019-09-11 NOTE — Progress Notes (Signed)
Patient: Cameron Jimenez MRN: [id number] Sex: male DOB: [birth year]  Provider: Keturah Shavers, MD Location of Care: [facility] Child Neurology  Note type: Routine return visit  Referral Source: Verner Mould, MD History from: patient, [facility] chart and mom Chief Complaint: Seizure  History of Present Illness: GID SCHOFFSTALL is a 24 y.o. male is here for follow-up management of seizure disorder.  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 He was last seen in December 2020 and since then he has been doing well with no clinical seizure activity and has been taking his medication regularly which is Keppra 750 mg nightly with no side effects and no clinical seizure activity as mentioned. He is doing well otherwise although he has significant psoriasis with exacerbation of his skin lesions compared to his previous visit and he has been seen and followed by rheumatology but not recently and currently due to having insurance issues, he is not following with his physicians regularly. He was also supposed to be transferred to adult neurology for his continuing of care but this has not happened yet.  Review of Systems: Review of system as per HPI, otherwise negative.  Past Medical History:  Diagnosis Date  . Epilepsy (HCC)   . Psoriasis    dx by Vaughan Sine  . Seizure [facility])    Hospitalizations: Head Injury: No., Nervous System Infections: No., Immunizations up to date: Yes.     Surgical History Past Surgical History:  Procedure Laterality Date  . CIRCUMCISION      Family History family history includes Cancer in his maternal grandmother.   Social History Social History   Socioeconomic History  . Marital status: Single    Spouse name:  Not on file  . Number of children: Not on file  . Years of education: Not on file  . Highest education level: Not on file  Occupational History  . Not on file  Tobacco Use  . Smoking status: Passive Smoke Exposure - Never Smoker  . Smokeless tobacco: Never Used  Substance and Sexual Activity  . Alcohol use: No  . Drug use: No  . Sexual activity: Yes    Birth control/protection: Condom  Other Topics Concern  . Not on file  Social History Narrative   Jeffree graduated from high school in 2014. He is not employed.   Parsa enjoys playing video games and spending time with friends.   He is living with his parents and brother.   Social Determinants of Health   Financial Resource Strain:   . Difficulty of Paying Living Expenses:   Food Insecurity:   . Worried About Programme researcher, broadcasting/film/video in the Last Year:   . Barista in the Last Year:   Transportation Needs:   . Freight forwarder (Medical):   Marland Kitchen Lack of Transportation (Non-Medical):   Physical Activity:   . Days of Exercise per Week:   . Minutes of Exercise per Session:   Stress:   . Feeling of Stress :   Social Connections:   . Frequency of Communication with Friends and Family:   . Frequency of Social Gatherings with Friends and Family:   . Attends Religious Services:   . Active Member of Clubs or Organizations:   . Attends Banker Meetings:   .  Marital Status:      No Known Allergies  Physical Exam BP 112/72   Pulse 76   Ht 5' 8.11" (1.73 m)   Wt 212 lb 4.9 oz (96.3 kg)   BMI 32.18 kg/m  Gen: Awake, alert, not in distress Skin: No rash, No neurocutaneous stigmata.  Extensive psoriasis plaques on extensors and over extremities with erythema of the face and forehead HEENT: Normocephalic, no dysmorphic features, no conjunctival injection, nares patent, mucous membranes moist, oropharynx clear. Neck: Supple, no meningismus. No focal tenderness. Resp: Clear to auscultation  bilaterally CV: Regular rate, normal S1/S2, no murmurs, no rubs Abd: BS present, abdomen soft, non-tender, non-distended. No hepatosplenomegaly or mass Ext: Warm and well-perfused. No deformities, no muscle wasting, ROM full.  Neurological Examination: MS: Awake, alert, interactive. Normal eye contact, answered the questions appropriately, speech was fluent,  Normal comprehension.  Attention and concentration were normal. Cranial Nerves: Pupils were equal and reactive to light ( 5-53mm);  normal fundoscopic exam with sharp discs, visual field full with confrontation test; EOM normal, no nystagmus; no ptsosis, no double vision, intact facial sensation, face symmetric with full strength of facial muscles, hearing intact to finger rub bilaterally, palate elevation is symmetric, tongue protrusion is symmetric with full movement to both sides.  Sternocleidomastoid and trapezius are with normal strength. Tone-Normal Strength-Normal strength in all muscle groups DTRs-  Biceps Triceps Brachioradialis Patellar Ankle  R 2+ 2+ 2+ 2+ 2+  L 2+ 2+ 2+ 2+ 2+   Plantar responses flexor bilaterally, no clonus noted Sensation: Intact to light touch,  Romberg negative. Coordination: No dysmetria on FTN test. No difficulty with balance. Gait: Normal walk and run. Tandem gait was normal. Was able to perform toe walking and heel walking without difficulty.   Assessment and Plan 1. 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 I discussed with patient and his mother that at this point I would continue the same dose of medication since he already failed 2 previous tapering of the medication. I discussed with patient the importance of appropriate sleep and limited screen time and not  skipping any dose of medication. If there is any clinical seizure activity then I would increase the dose of medication based on the clinical episodes but I do not think he needs a follow-up EEG at this point. He needs to continue follow-up with PCP and also with rheumatology on a regular basis to manage his psoriasis. I would like to see him in 8 months for follow-up visit although again I discussed with patient and his mother that it would be better for him to follow-up with adult neurology for which he needs to get a referral from his PCP.  He and his mother understood and agreed with the plan.  Meds ordered this encounter  Medications  . Levetiracetam 750 MG TB24    Sig: Take 1 tablet (750 mg total) by mouth at bedtime.    Dispense:  30 tablet    Refill:  8

## 2019-09-11 NOTE — Patient Instructions (Signed)
Continue the same dose of Keppra at 750 mg daily If there is any more seizure activity, we will increase the dose of Keppra Have adequate sleep and limited screen time When it is possible, will transfer you to adult neurology Follow-up with rheumatology for psoriasis Return in 8 months for follow-up visit

## 2020-05-13 ENCOUNTER — Ambulatory Visit (INDEPENDENT_AMBULATORY_CARE_PROVIDER_SITE_OTHER): Payer: Self-pay | Admitting: Neurology

## 2020-05-28 ENCOUNTER — Ambulatory Visit (INDEPENDENT_AMBULATORY_CARE_PROVIDER_SITE_OTHER): Payer: Self-pay | Admitting: Neurology

## 2020-07-17 ENCOUNTER — Ambulatory Visit (INDEPENDENT_AMBULATORY_CARE_PROVIDER_SITE_OTHER): Payer: Self-pay | Admitting: Neurology

## 2020-07-17 ENCOUNTER — Other Ambulatory Visit: Payer: Self-pay

## 2020-07-17 ENCOUNTER — Encounter (INDEPENDENT_AMBULATORY_CARE_PROVIDER_SITE_OTHER): Payer: Self-pay | Admitting: Neurology

## 2020-07-17 VITALS — BP 116/76 | HR 80 | Ht 67.91 in | Wt 190.9 lb

## 2020-07-17 DIAGNOSIS — G40909 Epilepsy, unspecified, not intractable, without status epilepticus: Secondary | ICD-10-CM

## 2020-07-17 MED ORDER — LEVETIRACETAM ER 750 MG PO TB24: 750.0000 mg | ORAL_TABLET | Freq: Every day | ORAL | 12 refills | Status: DC

## 2020-07-17 NOTE — Progress Notes (Signed)
Patient: Cameron Jimenez MRN: [id number] Sex: male DOB: 1995/08/29  Provider: Keturah Shavers, MD Location of Care: [facility]  Note type: Routine return visit  Referral Source: Verner Mould, MD History from: patient, Cameron Jimenez chart and mom Chief Complaint: Seizures  History of Present Illness: Cameron Jimenez is a 25 y.o. male is here for follow-up management of seizure disorder.  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 He was last seen in 09/2019. He has continued to do well from a neurologic perspective with no new seizure events. He continues to take Keppra extended release 750 mg nightly. He has no side effects and no clinical events. There have been some long standing problems with obtaining insurance. 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 He has been intermittently searching but has not found one. This has prevented his ability to get in with PCP, see a rheumatologist and get referral for adult neurology. His psoriasis has been out of control and has worsened since last visit with many lesions on his arms, legs and torso. No arthralgias but some can limit his ROM at times. He has not been on any medications for his psoriasis and has been doing home remedies and changing his diet which has not helped, they are out of all the medication the rheumatologist prescribed. He has no had any recent illnesses, ED visits or hospitalizations. He sleeps at 3 or 4 AM and wakes up at noon then mostly hangs with friends.   Review of Systems: Review of system as per HPI, otherwise negative.  Past Medical History:  Diagnosis Date  . Epilepsy (HCC)   . Psoriasis    dx by Vaughan Sine  . Seizure (HCC)     Hospitalizations: No., Head Injury: No., Nervous System Infections: No., Immunizations up to date: Yes.     Surgical History Past Surgical History:  Procedure Laterality Date  . CIRCUMCISION      Family History family history includes Cancer in his maternal grandmother.   Social History Social History   Socioeconomic History  . Marital status: Single    Spouse name: Not on file  . Number of Jimenez: Not on file  . Years of education: Not on file  . Highest education level: Not on file  Occupational History  . Not on file  Tobacco Use  . Smoking status: Passive Smoke Exposure - Never Smoker  . Smokeless tobacco: Never Used  Substance and Sexual Activity  . Alcohol use: No  . Drug use: No  . Sexual activity: Yes    Birth control/protection: Condom  Other Topics Concern  . Not on file  Social History Narrative   Kairo graduated from high school in 2014. He is not employed.   Wataru enjoys playing video games and spending time with friends.   He is living with his parents and brother.   Social Determinants of Health   Financial Resource Strain: Not on file  Food Insecurity: Not on file  Transportation Needs: Not on file  Physical Activity: Not on file  Stress: Not on file  Social Connections: Not on file     No Known Allergies  Physical Exam BP 116/76   Pulse 80   Ht 5' 7.91" (1.725 m)   Wt  190 lb 14.7 oz (86.6 kg)   BMI 29.10 kg/m  General: alert, well developed, well nourished, in no acute distress Head: normocephalic, no dysmorphic features Ears, Nose and Throat: Otoscopic: tympanic membranes normal; pharynx: oropharynx is pink without exudates or tonsillar hypertrophy Neck: supple, full range of motion, no cranial or cervical bruits Respiratory: auscultation clear Cardiovascular: no murmurs, pulses are normal Musculoskeletal: no skeletal deformities or apparent scoliosis Skin: severe psoriasis visible along arms and legs    Neurologic Exam  Mental Status: alert; oriented to person, place and year; knowledge is normal for age; language is normal Cranial Nerves: visual fields are full to double simultaneous stimuli; extraocular movements are full and conjugate; pupils are round reactive to light; funduscopic examination shows sharp disc margins with normal vessels; symmetric facial strength; midline tongue and uvula; Motor: Normal strength, tone and mass; good fine motor movements; no pronator drift Sensory: intact responses to cold Coordination: good finger-to-nose, rapid repetitive alternating movements and finger apposition Gait and Station: normal gait and station: patient is able to walk on heels, toes and tandem without difficulty; balance is adequate; Romberg exam is negative;  Reflexes: symmetric and diminished bilaterally; no clonus; bilateral flexor plantar responses   Assessment and Plan 1. 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 Despite good control, he has failed twice tapering off medication so plan to continue this medication for at least 2-3 years for now. If there is any clinical seizure activity then I would increase the dose of medication based on the clinical episodes but I do not think he needs a follow-up EEG at this point given normal EEGs in the past. Discussed in detail with Rivan the importance of finding a job to get insurance coverage so that he can find a PCP, rheumatologist and a referral to adult neurology. Plan to see back in 1 year if he has not yet established with an adult neurologist by then.   Meds ordered this encounter  Medications  . Levetiracetam 750 MG TB24    Sig: Take 1 tablet (750 mg total) by mouth at bedtime.     Dispense:  30 tablet    Refill:  12   No orders of the defined types were placed in this encounter.

## 2021-08-13 ENCOUNTER — Other Ambulatory Visit (INDEPENDENT_AMBULATORY_CARE_PROVIDER_SITE_OTHER): Payer: Self-pay | Admitting: Neurology

## 2021-08-18 ENCOUNTER — Ambulatory Visit (INDEPENDENT_AMBULATORY_CARE_PROVIDER_SITE_OTHER): Payer: Self-pay | Admitting: Neurology

## 2021-09-12 ENCOUNTER — Other Ambulatory Visit (INDEPENDENT_AMBULATORY_CARE_PROVIDER_SITE_OTHER): Payer: Self-pay | Admitting: Neurology

## 2021-09-16 ENCOUNTER — Ambulatory Visit (INDEPENDENT_AMBULATORY_CARE_PROVIDER_SITE_OTHER): Payer: Self-pay | Admitting: Neurology

## 2021-10-02 ENCOUNTER — Ambulatory Visit (INDEPENDENT_AMBULATORY_CARE_PROVIDER_SITE_OTHER): Payer: Self-pay | Admitting: Neurology

## 2021-10-14 ENCOUNTER — Ambulatory Visit (INDEPENDENT_AMBULATORY_CARE_PROVIDER_SITE_OTHER): Payer: Self-pay | Admitting: Neurology

## 2021-10-15 ENCOUNTER — Encounter (INDEPENDENT_AMBULATORY_CARE_PROVIDER_SITE_OTHER): Payer: Self-pay | Admitting: Neurology

## 2021-10-15 ENCOUNTER — Ambulatory Visit (INDEPENDENT_AMBULATORY_CARE_PROVIDER_SITE_OTHER): Payer: Self-pay | Admitting: Neurology

## 2021-10-15 VITALS — BP 110/80 | HR 88 | Wt 199.3 lb

## 2021-10-15 DIAGNOSIS — G40309 Generalized idiopathic epilepsy and epileptic syndromes, not intractable, without status epilepticus: Secondary | ICD-10-CM

## 2021-10-15 DIAGNOSIS — G40909 Epilepsy, unspecified, not intractable, without status epilepticus: Secondary | ICD-10-CM

## 2021-10-15 MED ORDER — LEVETIRACETAM ER 750 MG PO TB24: 750.0000 mg | ORAL_TABLET | Freq: Every day | ORAL | 9 refills | Status: DC

## 2021-10-15 NOTE — Progress Notes (Signed)
Patient: Cameron Jimenez MRN: [id number] Sex: male DOB: Aug 03, 1995  Provider: Keturah Shavers, MD Location of Care: [facility]  Note type: Routine return visit  Referral Source: PCP History from: mother, patient, and [facility] chart Chief Complaint: routine follow up for medication refill  History of Present Illness: Cameron Jimenez is a 26 y.o. male is here for follow-up management of seizure disorder. He has a diagnosis of juvenile myoclonic epilepsy since 2013 and was on Lamictal and then Keppra over the past several years with fairly good seizure control although he failed tapering and discontinue medication 2 times. His last EEG in May 2020 was normal.  He has not had any clinical seizure activity since 2020. Currently is taking fairly low-dose of Keppra at 750 mg daily, tolerating well with no side effects and with no seizure activity.  He usually sleeps well without any difficulty and with no awakening.  He has no behavioral or mood issues. He does have psoriasis with significant skin lesions but no other symptoms but he has not been seen and followed by rheumatology recently due to lack of insurance. He has not had any EEG recently and we were not able to refer the patient to adult neurology for the same reason of lack of insurance. He has been doing well overall and he and his mother do not have any other complaints or concerns at this time.   Review of Systems: Review of system as per HPI, otherwise negative.  Past Medical History:  Diagnosis Date   Epilepsy (HCC)    Psoriasis    dx by Vaughan Sine   Seizure [facility])    Hospitalizations: No., Head Injury: No., Nervous System Infections: No., Immunizations up to date: Yes.    Surgical History Past Surgical History:  Procedure Laterality Date   CIRCUMCISION      Family History family history includes Cancer in his maternal grandmother.   Social History Social History   Socioeconomic History   Marital  status: Single    Spouse name: Not on file   Number of children: Not on file   Years of education: Not on file   Highest education level: Not on file  Occupational History   Not on file  Tobacco Use   Smoking status: Never    Passive exposure: Yes   Smokeless tobacco: Never  Substance and Sexual Activity   Alcohol use: No   Drug use: No   Sexual activity: Yes    Birth control/protection: Condom  Other Topics Concern   Not on file  Social History Narrative   Antone graduated from high school in 2014. He is not employed.   Haeden enjoys playing video games and spending time with friends.   He is living with his parents and brother.   Social Determinants of Health   Financial Resource Strain: Not on file  Food Insecurity: Not on file  Transportation Needs: Not on file  Physical Activity: Not on file  Stress: Not on file  Social Connections: Not on file     No Known Allergies  Physical Exam BP 110/80   Pulse 88   Wt 199 lb 4.7 oz (90.4 kg)   BMI 30.38 kg/m  Gen: Awake, alert, not in distress Skin: Frequent and extensive skin lesions related to psoriasis HEENT: Normocephalic, no dysmorphic features, no conjunctival injection, nares patent, mucous membranes moist, oropharynx clear. Neck: Supple, no meningismus. No focal tenderness. Resp: Clear to auscultation bilaterally CV: Regular rate, normal S1/S2, no murmurs, no  rubs Abd: BS present, abdomen soft, non-tender, non-distended. No hepatosplenomegaly or mass Ext: Warm and well-perfused. No deformities, no muscle wasting, ROM full.  Neurological Examination: MS: Awake, alert, interactive. Normal eye contact, answered the questions appropriately, speech was fluent,  Normal comprehension.  Attention and concentration were normal. Cranial Nerves: Pupils were equal and reactive to light ( 5-20mm);  normal fundoscopic exam with sharp discs, visual field full with confrontation test; EOM normal, no nystagmus; no  ptsosis, no double vision, intact facial sensation, face symmetric with full strength of facial muscles, hearing intact to finger rub bilaterally, palate elevation is symmetric, tongue protrusion is symmetric with full movement to both sides.  Sternocleidomastoid and trapezius are with normal strength. Tone-Normal Strength-Normal strength in all muscle groups DTRs-  Biceps Triceps Brachioradialis Patellar Ankle  R 2+ 2+ 2+ 2+ 2+  L 2+ 2+ 2+ 2+ 2+   Plantar responses flexor bilaterally, no clonus noted Sensation: Intact to light touch, temperature, vibration, Romberg negative. Coordination: No dysmetria on FTN test. No difficulty with balance. Gait: Normal walk and run. Tandem gait was normal. Was able to perform toe walking and heel walking without difficulty.   Assessment and Plan 1. Seizure disorder (HCC)   2. Generalized tonic clonic epilepsy (HCC)    This is a 26 year old male with diagnosis of juvenile myoclonic epilepsy since 2013, currently on fairly low-dose of Keppra with no clinical seizure activity for the past 3 years and tolerating medication well with no side effects.  He has no focal findings on his neurological exam. Recommend to continue the same dose of Keppra at 750 mg daily If he develops any clinical seizure activity or episodes of jerking, he will call my office to increase the dose of medication. He will continue with adequate sleep and limit the screen time As soon as he gets his insurance, we will schedule for an EEG and also we may refer him to adult neurology I would like to see him in 8 months for follow-up visit or sooner if he develops seizure activity.  He and his mother understood and agreed with the plan.  Meds ordered this encounter  Medications   levETIRAcetam (KEPPRA XR) 750 MG 24 hr tablet    Sig: Take 1 tablet (750 mg total) by mouth at bedtime.    Dispense:  30 tablet    Refill:  9   No orders of the defined types were placed in this  encounter.

## 2021-10-15 NOTE — Patient Instructions (Signed)
Continue the same dose of Keppra at 750 mg every night Continue with adequate sleep and limited screen time As soon as we get insurance, call the office to schedule for an EEG and also we may need to follow-up with rheumatologist Call my office if there are any jerking episodes to increase the dose of Keppra Return in 8 months for follow-up visit

## 2022-06-15 ENCOUNTER — Ambulatory Visit (INDEPENDENT_AMBULATORY_CARE_PROVIDER_SITE_OTHER): Payer: Self-pay | Admitting: Neurology

## 2022-09-30 ENCOUNTER — Other Ambulatory Visit (INDEPENDENT_AMBULATORY_CARE_PROVIDER_SITE_OTHER): Payer: Self-pay | Admitting: Neurology

## 2022-09-30 NOTE — Telephone Encounter (Signed)
Attempted to contact patient to schedule a follow up appointment. Patient was unable to be reached.  LVM to call back.  SS, CCMA

## 2022-10-14 ENCOUNTER — Ambulatory Visit (INDEPENDENT_AMBULATORY_CARE_PROVIDER_SITE_OTHER): Payer: Self-pay | Admitting: Neurology

## 2022-10-14 VITALS — BP 120/70 | HR 93 | Ht 68.5 in | Wt 189.4 lb

## 2022-10-14 DIAGNOSIS — L409 Psoriasis, unspecified: Secondary | ICD-10-CM

## 2022-10-14 DIAGNOSIS — G40309 Generalized idiopathic epilepsy and epileptic syndromes, not intractable, without status epilepticus: Secondary | ICD-10-CM

## 2022-10-14 DIAGNOSIS — G40909 Epilepsy, unspecified, not intractable, without status epilepticus: Secondary | ICD-10-CM

## 2022-10-14 MED ORDER — LEVETIRACETAM ER 750 MG PO TB24: 750.0000 mg | ORAL_TABLET | Freq: Every day | ORAL | 10 refills | Status: DC

## 2022-10-14 NOTE — Patient Instructions (Signed)
Continue the same low-dose Keppra at 750 mg daily Continue with adequate sleep and limited screen time  Call my office if there is any seizure activity As soon as you get insurance, call the office to send a referral to adult neurology Return in 10 months for follow-up visit

## 2022-10-14 NOTE — Progress Notes (Signed)
Patient: Cameron Jimenez MRN: [id number] Sex: male DOB: 1995/10/07  Provider: Keturah Shavers, MD Location of Care: [facility]  Note type: Routine return visit  Referral Source: PCP History from: patient and [facility] chart Chief Complaint: Seizure disorder   History of Present Illness: Cameron Jimenez is a 27 y.o. male is here for follow-up management of seizure disorder. He has diagnosis of juvenile myoclonic epilepsy since 2013 and was initially on Lamictal and then Keppra for the past several years with good seizure control and no clinical seizure activity since 2020.   Prior to that he was tried to be off of medication x 2 which caused more breakthrough seizures. His last EEG was in May 2020 with normal result. He has been on very low-dose Keppra at 750 mg every night, tolerating well with no side effects and with no seizure activity. He does have psoriasis with significant skin lesions but not able to see rheumatology due to lack of insurance. We were also not able to transfer patient to adult neurology and not able to perform another EEG due to lack of insurance.    Review of Systems: Review of system as per HPI, otherwise negative.  Past Medical History:  Diagnosis Date   Epilepsy (HCC)    Psoriasis    dx by Vaughan Sine   Seizure [facility])    Hospitalizations: No., Head Injury: No., Nervous System Infections: No., Immunizations up to date: Yes.     Surgical History Past Surgical History:  Procedure Laterality Date   CIRCUMCISION      Family History family history includes Cancer in his maternal grandmother.   Social History Social History   Socioeconomic History   Marital status: Single    Spouse name: Not on file   Number of children: Not on file   Years of education: Not on file   Highest education level: Not on file  Occupational History   Not on file  Tobacco Use   Smoking status: Never    Passive exposure: Yes   Smokeless tobacco:  Never  Substance and Sexual Activity   Alcohol use: No   Drug use: No   Sexual activity: Yes    Birth control/protection: Condom  Other Topics Concern   Not on file  Social History Narrative   Dove graduated from high school in 2014. He is not employed.   Cameron Jimenez enjoys playing video games and spending time with friends.   He is living with his parents and brother.   Social Determinants of Health   Financial Resource Strain: Not on file  Food Insecurity: Not on file  Transportation Needs: Not on file  Physical Activity: Not on file  Stress: Not on file  Social Connections: Not on file     No Known Allergies  Physical Exam BP 120/70   Pulse 93   Ht 5' 8.5" (1.74 m)   Wt 189 lb 6 oz (85.9 kg)   BMI 28.37 kg/m  Gen: Awake, alert, not in distress, Non-toxic appearance. Skin: He has a lot of skin lesions on his extensors related to the psoriasis HEENT: Normocephalic, no dysmorphic features, no conjunctival injection, nares patent, mucous membranes moist, oropharynx clear. Neck: Supple, no meningismus, no lymphadenopathy,  Resp: Clear to auscultation bilaterally CV: Regular rate, normal S1/S2, no murmurs, no rubs Abd: Bowel sounds present, abdomen soft, non-tender, non-distended.  No hepatosplenomegaly or mass. Ext: Warm and well-perfused. No deformity, no muscle wasting, ROM full.  Neurological Examination: MS- Awake, alert, interactive Cranial  Nerves- Pupils equal, round and reactive to light (5 to 3mm); fix and follows with full and smooth EOM; no nystagmus; no ptosis, funduscopy with normal sharp discs, visual field full by looking at the toys on the side, face symmetric with smile.  Hearing intact to bell bilaterally, palate elevation is symmetric, and tongue protrusion is symmetric. Tone- Normal Strength-Seems to have good strength, symmetrically by observation and passive movement. Reflexes-    Biceps Triceps Brachioradialis Patellar Ankle  R 2+ 2+ 2+ 2+  2+  L 2+ 2+ 2+ 2+ 2+   Plantar responses flexor bilaterally, no clonus noted Sensation- Withdraw at four limbs to stimuli. Coordination- Reached to the object with no dysmetria Gait: Normal walk without any coordination or balance issues.   Assessment and Plan 1. Seizure disorder (HCC)   2. Generalized tonic clonic epilepsy (HCC)    This is a 27 year old male with history of juvenile myoclonic epilepsy and psoriasis, currently on low-dose Keppra with no clinical seizure activity over the past 4 years with no other issues except for psoriasis.  He has no focal findings on his neurological examination. Recommend to continue the same dose of Keppra at 750 mg daily No need for follow-up EEG due to lack of insurance and the fact that we are not going to discontinue medication even with normal EEG due to breakthrough seizures. He will continue with adequate sleep and limited screen time as the main triggers for the seizure I would like to see him in 10 months for follow-up visit but if he was able to get insurance, mother will call my office to transfer him to adult neurology for further evaluation and treatment.  He and his mother understood and agreed with the plan.  Meds ordered this encounter  Medications   levETIRAcetam (KEPPRA XR) 750 MG 24 hr tablet    Sig: Take 1 tablet (750 mg total) by mouth at bedtime.    Dispense:  30 tablet    Refill:  10   No orders of the defined types were placed in this encounter.

## 2023-08-16 ENCOUNTER — Ambulatory Visit (INDEPENDENT_AMBULATORY_CARE_PROVIDER_SITE_OTHER): Payer: Self-pay | Admitting: Neurology

## 2023-08-17 ENCOUNTER — Other Ambulatory Visit (INDEPENDENT_AMBULATORY_CARE_PROVIDER_SITE_OTHER): Payer: Self-pay | Admitting: Neurology

## 2023-08-27 ENCOUNTER — Ambulatory Visit (INDEPENDENT_AMBULATORY_CARE_PROVIDER_SITE_OTHER): Payer: Self-pay | Admitting: Neurology

## 2023-08-27 ENCOUNTER — Encounter (INDEPENDENT_AMBULATORY_CARE_PROVIDER_SITE_OTHER): Payer: Self-pay | Admitting: Neurology

## 2023-08-27 VITALS — BP 122/62 | HR 66 | Ht 67.28 in | Wt 164.5 lb

## 2023-08-27 DIAGNOSIS — G40309 Generalized idiopathic epilepsy and epileptic syndromes, not intractable, without status epilepticus: Secondary | ICD-10-CM

## 2023-08-27 DIAGNOSIS — G40909 Epilepsy, unspecified, not intractable, without status epilepticus: Secondary | ICD-10-CM

## 2023-08-27 MED ORDER — LEVETIRACETAM ER 750 MG PO TB24: 750.0000 mg | ORAL_TABLET | Freq: Every day | ORAL | 11 refills | Status: AC

## 2023-08-27 NOTE — Progress Notes (Signed)
 Patient: Cameron Jimenez MRN: [id number] Sex: male DOB: May 02, 1995  Provider: Norwood Abu, MD Location of Care: [facility]  Note type: Routine return visit  Referral Source: Carlie Simmie BRAVO, MD History from: patient, [facility] chart, and Mom Chief Complaint: Seizures   History of Present Illness: Cameron Jimenez is a 28 y.o. male is here for follow-up management of seizure disorder. He has a diagnosis of juvenile myoclonic epilepsy since 2013, was initially on Lamictal  and then Keppra  for the past several years with fairly good seizure control and no clinical seizure activity since 2020. He has failed tapering of medications 2 times with breakthrough seizures. His last EEG was in 2020 with normal result He has been on low-dose Keppra  which is 750 mg long-acting form every night with no side effects and no seizure activity for the past few years. He does have psoriasis and seen by rheumatology.  He has no other medical issues and sleeping well and has been taking his medication regularly without any missing doses over the past several months and since his last visit in September 2024. Since he does not have insurance it is not possible to send him to adult neurology or performing follow-up EEG although since he has failed tapering the medication 2 times, he needs to continue medication for long time regardless of EEG result.  Review of Systems: Review of system as per HPI, otherwise negative.  Past Medical History:  Diagnosis Date   Epilepsy (HCC)    Psoriasis    dx by Elita   Seizure [facility])    Hospitalizations: No., Head Injury: No., Nervous System Infections: No., Immunizations up to date: Yes.    Surgical History Past Surgical History:  Procedure Laterality Date   CIRCUMCISION      Family History family history includes Cancer in his maternal grandmother.   Social History Social History   Socioeconomic History   Marital status: Single    Spouse  name: Not on file   Number of children: Not on file   Years of education: Not on file   Highest education level: Not on file  Occupational History   Not on file  Tobacco Use   Smoking status: Never    Passive exposure: Yes   Smokeless tobacco: Never  Substance and Sexual Activity   Alcohol use: No   Drug use: No   Sexual activity: Yes    Birth control/protection: Condom  Other Topics Concern   Not on file  Social History Narrative   Yer graduated from high school in 2014. He is not employed.   Kimani enjoys playing video games and spending time with friends.   He is living with his parents and brother.   Social Drivers of Corporate investment banker Strain: Not on file  Food Insecurity: Not on file  Transportation Needs: Not on file  Physical Activity: Not on file  Stress: Not on file  Social Connections: Not on file     No Known Allergies  Physical Exam BP 122/62   Pulse 66   Ht 5' 7.28 (1.709 m)   Wt 164 lb 7.4 oz (74.6 kg)   BMI 25.54 kg/m  Gen: Awake, alert, not in distress Skin: Multiple areas with psoriasis plaque over his body and extremities HEENT: Normocephalic, no dysmorphic features, no conjunctival injection, nares patent, mucous membranes moist, oropharynx clear. Neck: Supple, no meningismus. No focal tenderness. Resp: Clear to auscultation bilaterally CV: Regular rate, normal S1/S2, no murmurs, no rubs Abd:  BS present, abdomen soft, non-tender, non-distended. No hepatosplenomegaly or mass Ext: Warm and well-perfused. No deformities, no muscle wasting, ROM full.  Neurological Examination: MS: Awake, alert, interactive. Normal eye contact, answered the questions appropriately, speech was fluent,  Normal comprehension.  Attention and concentration were normal. Cranial Nerves: Pupils were equal and reactive to light ( 5-24mm);  normal fundoscopic exam with sharp discs, visual field full with confrontation test; EOM normal, no nystagmus; no  ptsosis, no double vision, intact facial sensation, face symmetric with full strength of facial muscles, hearing intact to finger rub bilaterally, palate elevation is symmetric, tongue protrusion is symmetric with full movement to both sides.  Sternocleidomastoid and trapezius are with normal strength. Tone-Normal Strength-Normal strength in all muscle groups DTRs-  Biceps Triceps Brachioradialis Patellar Ankle  R 2+ 2+ 2+ 2+ 2+  L 2+ 2+ 2+ 2+ 2+   Plantar responses flexor bilaterally, no clonus noted Sensation: Intact to light touch, temperature, vibration, Romberg negative. Coordination: No dysmetria on FTN test. No difficulty with balance. Gait: Normal walk and run. Tandem gait was normal. Was able to perform toe walking and heel walking without difficulty.   Assessment and Plan 1. Seizure disorder (HCC)   2. Generalized tonic clonic epilepsy (HCC)    This is a 28 year old male with generalized seizure disorder and juvenile myoclonic epilepsy, currently on low-dose Keppra  with no clinical seizure activity over the past 5 years and with the last EEG normal.  He has no new findings on his neurological examination and taking his medication regularly without any missing doses. Recommend to continue the same dose of Keppra  which is long-acting form at 750 mg every night Continue with adequate sleep and limited screen time Mother will call my office if he develops any seizure activity No further testing or EEG needed at this time He will return in 1 year for a follow-up visit or sooner if he develops any seizure activity.  He and his mother understood and agreed with the plan.  Meds ordered this encounter  Medications   levETIRAcetam  (KEPPRA  XR) 750 MG 24 hr tablet    Sig: Take 1 tablet (750 mg total) by mouth at bedtime.    Dispense:  30 tablet    Refill:  11   No orders of the defined types were placed in this encounter.

## 2023-08-27 NOTE — Patient Instructions (Signed)
 Continue the same dose of Keppra  at 1 tablet every night Continue with adequate sleep and limited screen time Call my office if there is any seizure activity Return in 1 year for follow-up visit

## 2023-09-28 ENCOUNTER — Emergency Department (HOSPITAL_COMMUNITY)
Admission: EM | Admit: 2023-09-28 | Discharge: 2023-09-28 | Disposition: A | Discharge status: Home / Self Care | Payer: Self-pay | Attending: Student | Admitting: Student

## 2023-09-28 ENCOUNTER — Encounter (HOSPITAL_COMMUNITY): Payer: Self-pay

## 2023-09-28 ENCOUNTER — Other Ambulatory Visit: Payer: Self-pay

## 2023-09-28 DIAGNOSIS — H9312 Tinnitus, left ear: Secondary | ICD-10-CM | POA: Insufficient documentation

## 2023-09-28 NOTE — ED Triage Notes (Signed)
 Pt reports ringing in his left ear x 1 month. He is concerned about ear wax impaction. Denies hearing difficulties or ear ache.

## 2023-09-28 NOTE — ED Provider Notes (Signed)
  EMERGENCY DEPARTMENT AT [facility] Provider Note   CSN: [id number] Arrival date & time: 09/28/23  1450     Patient presents with: Tinnitus   Cameron Jimenez is a 28 y.o. male.   28 y.o male with a PMH of cerumen impaction presents to the ED with a chief complaint cerumen impaction and tinnitus to the left ear.  Reports symptoms began a few days ago, he reports he is having a ringing sensation to the left ear.  He does not try anything for this at home.  He does have a prior history of this where he had to have his ears irrigated.  He reports no dizziness, no headache, no nausea, no vomiting, no trauma.  The history is provided by the patient.       Prior to Admission medications   Medication Sig Start Date End Date Taking? Authorizing Provider  alclomethasone (ACLOVATE) 0.05 % cream APPLY TO AFFECTED AREAS OF FACE 2 TIMES DAILY AS NEEDED Patient not taking: Reported on 08/27/2023 04/26/15   [provider]  doxycycline  (VIBRAMYCIN ) 100 MG capsule Take 1 capsule (100 mg total) by mouth 2 (two) times daily. Patient not taking: Reported on 08/27/2023 10/18/15   Jamelle Lorrayne HERO, NP  levETIRAcetam  (KEPPRA  XR) 750 MG 24 hr tablet Take 1 tablet (750 mg total) by mouth at bedtime. 08/27/23   Corinthia Blossom, MD  methotrexate (RHEUMATREX) 2.5 MG tablet TAKE 2 TABLETS BY MOUTH EVERY 12 HOURS x 3 doses per (6 tabs per week) Patient not taking: Reported on 08/27/2023 09/01/16   [provider]  traMADol  (ULTRAM ) 50 MG tablet Take 1 tablet (50 mg total) by mouth every 6 (six) hours as needed. Patient not taking: Reported on 08/27/2023 10/18/15   Jamelle Lorrayne HERO, NP  triamcinolone cream (KENALOG) 0.1 % APPLY TO AFFECTED AREAS OF TRUNK AND EXTREMITIES 2 TIMES DAILY AS NEEDED Patient not taking: Reported on 08/27/2023 04/26/15   [provider]    Allergies: Patient has no known allergies.    Review of Systems  Constitutional:  Negative for fever.  HENT:   Positive for ear pain. Negative for ear discharge.     Updated Vital Signs BP 116/74   Pulse 71   Temp 98.6 F (37 C)   Resp 18   Ht 5' 7 (1.702 m)   Wt 74.8 kg   SpO2 99%   BMI 25.84 kg/m   Physical Exam Vitals and nursing note reviewed.  Constitutional:      Appearance: Normal appearance.  HENT:     Head: Normocephalic and atraumatic.     Right Ear: Hearing and tympanic membrane normal. No tenderness. There is no impacted cerumen. No foreign body.     Left Ear: Hearing and tympanic membrane normal. No tenderness. There is no impacted cerumen. No foreign body.     Ears:     Comments: Bilateral TMs without any cerumen impaction, no erythema, no tenderness, no effusion noted.    Mouth/Throat:     Mouth: Mucous membranes are moist.  Cardiovascular:     Rate and Rhythm: Normal rate.  Pulmonary:     Effort: Pulmonary effort is normal.  Abdominal:     General: Abdomen is flat.  Musculoskeletal:     Cervical back: Normal range of motion and neck supple.  Skin:    General: Skin is warm and dry.  Neurological:     Mental Status: He is alert and oriented to person, place, and time.     (  all labs ordered are listed, but only abnormal results are displayed) Labs Reviewed - No data to display  EKG: None  Radiology: No results found.   Procedures   Medications Ordered in the ED - No data to display                                  Medical Decision Making    Patient presented to the ED with a chief complaint of tendinitis, reports has been having the symptoms for approximately a couple days.  Prior episode in the past similar to this, where he had a cerumen impaction.  According to his mother at the bedside he has had peroxide, ointment, attempted to remove the cerumen without any improvement in his symptoms.  On evaluation bilateral TMs are clear, there is no cerumen impaction, effusion, injection noted.  His vitals are within normal limits.  No headache, no  dizziness, no other complaints reported.   We discussed appropriate follow-up with ENT, he is otherwise hemodynamically stable.  No further emergent workup noted.   Portions of this note were generated with Scientist, clinical (histocompatibility and immunogenetics). Dictation errors may occur despite best attempts at proofreading.      Final diagnoses:  Tinnitus of left ear    ED Discharge Orders     None          Maureen Broad, PA-C 09/28/23 2207    Albertina Dixon, MD 09/29/23 [phone]

## 2023-09-28 NOTE — Discharge Instructions (Addendum)
 I did not see any cerumen on both of your ears today.  I did put the number to an ENT if the ringing in your ears continues.

## 2024-08-25 ENCOUNTER — Ambulatory Visit (INDEPENDENT_AMBULATORY_CARE_PROVIDER_SITE_OTHER): Payer: Self-pay | Admitting: Neurology
